# Patient Record
Sex: Female | Born: 1995 | Race: Black or African American | Hispanic: No | Marital: Single | Smoking: Never smoker
Health system: Southern US, Community
[De-identification: ages and names within clinical notes are randomized; demographics above are authoritative.]

## PROBLEM LIST (undated history)

## (undated) DIAGNOSIS — E119 Type 2 diabetes mellitus without complications: Secondary | ICD-10-CM

## (undated) DIAGNOSIS — R569 Unspecified convulsions: Secondary | ICD-10-CM

---
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
## 2015-04-23 ENCOUNTER — Emergency Department (HOSPITAL_COMMUNITY): Payer: Medicaid Other

## 2015-04-23 ENCOUNTER — Encounter (HOSPITAL_COMMUNITY): Payer: Self-pay

## 2015-04-23 DIAGNOSIS — G510 Bell's palsy: Secondary | ICD-10-CM | POA: Diagnosis not present

## 2015-04-23 DIAGNOSIS — R29810 Facial weakness: Secondary | ICD-10-CM | POA: Diagnosis present

## 2015-04-23 LAB — CBC
HCT: 37.9 % (ref 36.0–46.0)
HEMOGLOBIN: 12.4 g/dL (ref 12.0–15.0)
MCH: 21.2 pg — ABNORMAL LOW (ref 26.0–34.0)
MCHC: 32.7 g/dL (ref 30.0–36.0)
MCV: 64.8 fL — ABNORMAL LOW (ref 78.0–100.0)
PLATELETS: 272 10*3/uL (ref 150–400)
RBC: 5.85 MIL/uL — ABNORMAL HIGH (ref 3.87–5.11)
RDW: 15.3 % (ref 11.5–15.5)
WBC: 5.6 10*3/uL (ref 4.0–10.5)

## 2015-04-23 LAB — DIFFERENTIAL
BASOS ABS: 0 10*3/uL (ref 0.0–0.1)
Basophils Relative: 0 %
EOS ABS: 0.1 10*3/uL (ref 0.0–0.7)
Eosinophils Relative: 1 %
LYMPHS ABS: 2.2 10*3/uL (ref 0.7–4.0)
Lymphocytes Relative: 40 %
MONO ABS: 0.3 10*3/uL (ref 0.1–1.0)
Monocytes Relative: 6 %
NEUTROS PCT: 53 %
Neutro Abs: 3 10*3/uL (ref 1.7–7.7)

## 2015-04-23 LAB — I-STAT CHEM 8, ED
BUN: 10 mg/dL (ref 6–20)
CALCIUM ION: 1.2 mmol/L (ref 1.12–1.23)
Chloride: 101 mmol/L (ref 101–111)
Creatinine, Ser: 0.6 mg/dL (ref 0.44–1.00)
GLUCOSE: 100 mg/dL — AB (ref 65–99)
HCT: 44 % (ref 36.0–46.0)
HEMOGLOBIN: 15 g/dL (ref 12.0–15.0)
Potassium: 3.9 mmol/L (ref 3.5–5.1)
Sodium: 140 mmol/L (ref 135–145)
TCO2: 24 mmol/L (ref 0–100)

## 2015-04-23 LAB — PROTIME-INR
INR: 1.1 (ref 0.00–1.49)
Prothrombin Time: 14.4 seconds (ref 11.6–15.2)

## 2015-04-23 LAB — APTT: APTT: 26 s (ref 24–37)

## 2015-04-23 LAB — I-STAT TROPONIN, ED: TROPONIN I, POC: 0 ng/mL (ref 0.00–0.08)

## 2015-04-23 NOTE — ED Notes (Signed)
Pt reports onset 11p-12a last night pt woke up and couldn't close left eye, left facial droop, left side of face feels numb and left side of neck painful when turning neck towards the right. .  No difficulty swallowing.

## 2015-04-23 NOTE — ED Notes (Signed)
Called for triage 3 times. No answer

## 2015-04-24 ENCOUNTER — Emergency Department (HOSPITAL_COMMUNITY)
Admission: EM | Admit: 2015-04-24 | Discharge: 2015-04-24 | Disposition: A | Discharge status: Home / Self Care | Payer: Medicaid Other | Attending: Emergency Medicine | Admitting: Emergency Medicine

## 2015-04-24 DIAGNOSIS — G510 Bell's palsy: Secondary | ICD-10-CM

## 2015-04-24 HISTORY — DX: Unspecified convulsions: R56.9

## 2015-04-24 LAB — COMPREHENSIVE METABOLIC PANEL
ALT: 16 U/L (ref 14–54)
ANION GAP: 10 (ref 5–15)
AST: 20 U/L (ref 15–41)
Albumin: 4 g/dL (ref 3.5–5.0)
Alkaline Phosphatase: 58 U/L (ref 38–126)
BUN: 6 mg/dL (ref 6–20)
CHLORIDE: 103 mmol/L (ref 101–111)
CO2: 23 mmol/L (ref 22–32)
Calcium: 9.6 mg/dL (ref 8.9–10.3)
Creatinine, Ser: 0.62 mg/dL (ref 0.44–1.00)
Glucose, Bld: 105 mg/dL — ABNORMAL HIGH (ref 65–99)
Potassium: 3.7 mmol/L (ref 3.5–5.1)
SODIUM: 136 mmol/L (ref 135–145)
Total Bilirubin: 0.7 mg/dL (ref 0.3–1.2)
Total Protein: 7.8 g/dL (ref 6.5–8.1)

## 2015-04-24 MED ORDER — VALACYCLOVIR HCL 1 G PO TABS: 1000.0000 mg | ORAL_TABLET | Freq: Three times a day (TID) | ORAL | Status: AC

## 2015-04-24 MED ORDER — ARTIFICIAL TEARS OP OINT: TOPICAL_OINTMENT | OPHTHALMIC | Status: DC | PRN

## 2015-04-24 MED ORDER — PREDNISONE 20 MG PO TABS
60.0000 mg | ORAL_TABLET | Freq: Once | ORAL | Status: AC
  Administered 2015-04-24: 60 mg via ORAL
  Filled 2015-04-24: qty 3

## 2015-04-24 MED ORDER — VALACYCLOVIR HCL 500 MG PO TABS
1000.0000 mg | ORAL_TABLET | Freq: Once | ORAL | Status: AC
  Administered 2015-04-24: 1000 mg via ORAL
  Filled 2015-04-24: qty 2

## 2015-04-24 MED ORDER — PREDNISONE 20 MG PO TABS: 60.0000 mg | ORAL_TABLET | Freq: Every day | ORAL | Status: DC

## 2015-04-24 NOTE — ED Provider Notes (Addendum)
TIME SEEN: 4:55 AM  CHIEF COMPLAINT: Left-sided facial droop  HPI: Pt is a 20 y.o. female with history of seizures not on medications who presents to the emergency department with complaints of left-sided facial droop that started last night. States she feels numbness to the lateral left cheek and around the left upper lip. No headache. No history of head injury or neck injury. Does have minimal amount of pain when she turns her head towards the right. No other numbness or focal weakness. No other medical problems. No recent seizure activity. Not on Anticoagulation.  ROS: See HPI Constitutional: no fever  Eyes: no drainage  ENT: no runny nose   Cardiovascular:  no chest pain  Resp: no SOB  GI: no vomiting GU: no dysuria Integumentary: no rash  Allergy: no hives  Musculoskeletal: no leg swelling  Neurological: no slurred speech ROS otherwise negative  PAST MEDICAL HISTORY/PAST SURGICAL HISTORY:  Past Medical History  Diagnosis Date  . Seizures (HCC)     MEDICATIONS:  Prior to Admission medications   Not on File    ALLERGIES:  No Known Allergies  SOCIAL HISTORY:  Social History  Substance Use Topics  . Smoking status: Never Smoker   . Smokeless tobacco: Not on file  . Alcohol Use: No    FAMILY HISTORY: History reviewed. No pertinent family history.  EXAM: BP 123/85 mmHg  Pulse 89  Temp(Src) 99.2 F (37.3 C) (Oral)  Resp 13  Ht 4\' 11"  (1.499 m)  Wt 118 lb 11.2 oz (53.842 kg)  BMI 23.96 kg/m2  SpO2 100%  LMP 04/07/2015 CONSTITUTIONAL: Alert and oriented and responds appropriately to questions. Well-appearing; well-nourished, Afebrile, nontoxic HEAD: Normocephalic EYES: Conjunctivae clear, PERRL ENT: normal nose; no rhinorrhea; moist mucous membranes; TMs are clear bilaterally without erythema, purulence, bulging, perforation, effusion.  No cerumen impaction or sign of foreign body in the external auditory canal. No inflammation, erythema or drainage from the  external auditory canal. No signs of mastoiditis. No pain with manipulation of the pinna bilaterally. No pharyngeal erythema or petechiae, no tonsillar hypertrophy or exudate, no uvular deviation, no trismus or drooling, normal phonation, no stridor, no dental caries or abscess noted, no Ludwig's angina, tongue sits flat in the bottom of the mouth NECK: Supple, no meningismus, no LAD; no midline spinal tenderness or step-off or deformity, no significant pain with movement of the neck and she has full range of motion in her neck, no nuchal rigidity CARD: RRR; S1 and S2 appreciated; no murmurs, no clicks, no rubs, no gallops RESP: Normal chest excursion without splinting or tachypnea; breath sounds clear and equal bilaterally; no wheezes, no rhonchi, no rales, no hypoxia or respiratory distress, speaking full sentences ABD/GI: Normal bowel sounds; non-distended; soft, non-tender, no rebound, no guarding, no peritoneal signs BACK:  The back appears normal and is non-tender to palpation, there is no CVA tenderness EXT: Normal ROM in all joints; non-tender to palpation; no edema; normal capillary refill; no cyanosis, no calf tenderness or swelling    SKIN: Normal color for age and race; warm; no rash NEURO: Moves all extremities equally, sensation to light touch intact diffusely, patient has a left-sided facial droop involving the forehead reports slightly diminished sensation over the left upper lip and left lateral cheek but otherwise sensation to the left face and right face is normal. Otherwise cranial nerves II through XII intact; drinks 5/5 in all 4 extremities. PSYCH: The patient's mood and manner are appropriate. Grooming and personal hygiene are appropriate.  MEDICAL DECISION MAKING: Patient here with Bell's palsy. She has forehead involvement with left-sided facial paralysis. No other neurologic deficit on exam. No rash seen. No sign of Ramsay Hunt syndrome. Labs, head CT ordered in triage are  unremarkable. Doubt deep space neck infection, meningitis, carotid dissection. Will treat with Valtrex and prednisone for the next week. Have advised her use an eye patch when she is sleeping and will discharge with prescription for Lacri-Lube ointment. Will give outpatient follow-up information. I do not feel she needs any other further emergent workup. Discussed with patient and mother return precautions. They verbalize understanding and are comfortable with this plan.     EKG Interpretation  Date/Time:  Tuesday April 23 2015 23:06:14 EDT Ventricular Rate:  80 PR Interval:  162 QRS Duration: 68 QT Interval:  382 QTC Calculation: 440 R Axis:   74 Text Interpretation:  Normal sinus rhythm with sinus arrhythmia Normal ECG No old tracing to compare Confirmed by Alizee Maple,  DO, Nithila Sumners [phone]) on 04/24/2015 5:55:54 AM        Julia Maw Ashelyn Mccravy, DO 04/24/15 0531  Julia Maw Nikkolas Coomes, DO 04/24/15 1914

## 2015-04-24 NOTE — Discharge Instructions (Signed)
I recommend you wear an eye patch when you aresleeping because you are at risk for a corneal abrasion if you are not able to fully close your eye.   Bell Palsy Bell palsy is a condition in which the muscles on one side of the face become paralyzed. This often causes one side of the face to droop. It is a common condition and most people recover completely. RISK FACTORS Risk factors for Bell palsy include:  Pregnancy.  Diabetes.  An infection by a virus, such as infections that cause cold sores. CAUSES  Bell palsy is caused by damage to or inflammation of a nerve in your face. It is unclear why this happens, but an infection by a virus may lead to it. Most of the time the reason it happens is unknown. SIGNS AND SYMPTOMS  Symptoms can range from mild to severe and can take place over a number of hours. Symptoms may include:  Being unable to:  Raise one or both eyebrows.  Close one or both eyes.  Feel parts of your face (facial numbness).  Drooping of the eyelid and corner of the mouth.  Weakness in the face.  Paralysis of half your face.  Loss of taste.  Sensitivity to loud noises.  Difficulty chewing.  Tearing up of the affected eye.  Dryness in the affected eye.  Drooling.  Pain behind one ear. DIAGNOSIS  Diagnosis of Bell palsy may include:  A medical history and physical exam.  An MRI.  A CT scan.  Electromyography (EMG). This is a test that checks how your nerves are working. TREATMENT  Treatment may include antiviral medicine to help shorten the length of the condition. Sometimes treatment is not needed and the symptoms go away on their own. HOME CARE INSTRUCTIONS   Take medicines only as directed by your health care provider.  Do facial massages and exercises as directed by your health care provider.  If your eye is affected:  Use moisturizing eye drops to prevent drying of your eye as directed by your health care provider.  Protect your eye as  directed by your health care provider. SEEK MEDICAL CARE IF:  Your symptoms do not get better or get worse.  You are drooling.  Your eye is red, irritated, or hurts. SEEK IMMEDIATE MEDICAL CARE IF:   Another part of your body feels weak or numb.  You have difficulty swallowing.  You have a fever along with symptoms of Bell palsy.  You develop neck pain. MAKE SURE YOU:   Understand these instructions.  Will watch your condition.  Will get help right away if you are not doing well or get worse.   This information is not intended to replace advice given to you by your health care provider. Make sure you discuss any questions you have with your health care provider.   Document Released: 01/19/2005 Document Revised: 10/10/2014 Document Reviewed: 04/28/2013 Elsevier Interactive Patient Education [employer]2016 Elsevier Inc.

## 2017-04-24 ENCOUNTER — Other Ambulatory Visit: Payer: Self-pay

## 2017-04-24 ENCOUNTER — Encounter (HOSPITAL_COMMUNITY): Payer: Self-pay | Admitting: Emergency Medicine

## 2017-04-24 DIAGNOSIS — Z794 Long term (current) use of insulin: Secondary | ICD-10-CM

## 2017-04-24 DIAGNOSIS — E871 Hypo-osmolality and hyponatremia: Secondary | ICD-10-CM | POA: Diagnosis present

## 2017-04-24 DIAGNOSIS — E1010 Type 1 diabetes mellitus with ketoacidosis without coma: Principal | ICD-10-CM | POA: Diagnosis present

## 2017-04-24 DIAGNOSIS — E875 Hyperkalemia: Secondary | ICD-10-CM | POA: Diagnosis present

## 2017-04-24 DIAGNOSIS — Z98818 Other dental procedure status: Secondary | ICD-10-CM

## 2017-04-24 DIAGNOSIS — N179 Acute kidney failure, unspecified: Secondary | ICD-10-CM | POA: Diagnosis present

## 2017-04-24 DIAGNOSIS — Z23 Encounter for immunization: Secondary | ICD-10-CM

## 2017-04-24 DIAGNOSIS — Z793 Long term (current) use of hormonal contraceptives: Secondary | ICD-10-CM

## 2017-04-24 NOTE — ED Triage Notes (Signed)
Pt states she is having increase fatigue, poor appetite,  No drinking well and loosing weight rapidly, denies any fever or chills no urinary symptoms states she is having 9/10 lower back pain, denies any recent injury.

## 2017-04-25 ENCOUNTER — Inpatient Hospital Stay (HOSPITAL_COMMUNITY)
Admission: EM | Admit: 2017-04-25 | Discharge: 2017-04-27 | DRG: 638 | Disposition: A | Discharge status: Home / Self Care | Payer: Medicaid Other | Attending: Internal Medicine | Admitting: Internal Medicine

## 2017-04-25 ENCOUNTER — Encounter (HOSPITAL_COMMUNITY): Payer: Self-pay

## 2017-04-25 ENCOUNTER — Emergency Department (HOSPITAL_COMMUNITY): Payer: Medicaid Other

## 2017-04-25 DIAGNOSIS — Z793 Long term (current) use of hormonal contraceptives: Secondary | ICD-10-CM | POA: Diagnosis not present

## 2017-04-25 DIAGNOSIS — Z98818 Other dental procedure status: Secondary | ICD-10-CM | POA: Diagnosis not present

## 2017-04-25 DIAGNOSIS — E875 Hyperkalemia: Secondary | ICD-10-CM | POA: Diagnosis present

## 2017-04-25 DIAGNOSIS — E871 Hypo-osmolality and hyponatremia: Secondary | ICD-10-CM | POA: Diagnosis present

## 2017-04-25 DIAGNOSIS — E1010 Type 1 diabetes mellitus with ketoacidosis without coma: Secondary | ICD-10-CM | POA: Diagnosis present

## 2017-04-25 DIAGNOSIS — N179 Acute kidney failure, unspecified: Secondary | ICD-10-CM | POA: Diagnosis present

## 2017-04-25 DIAGNOSIS — Z23 Encounter for immunization: Secondary | ICD-10-CM | POA: Diagnosis not present

## 2017-04-25 DIAGNOSIS — E1310 Other specified diabetes mellitus with ketoacidosis without coma: Secondary | ICD-10-CM

## 2017-04-25 DIAGNOSIS — E1110 Type 2 diabetes mellitus with ketoacidosis without coma: Secondary | ICD-10-CM | POA: Diagnosis present

## 2017-04-25 DIAGNOSIS — E1069 Type 1 diabetes mellitus with other specified complication: Secondary | ICD-10-CM

## 2017-04-25 DIAGNOSIS — Z794 Long term (current) use of insulin: Secondary | ICD-10-CM | POA: Diagnosis not present

## 2017-04-25 LAB — BASIC METABOLIC PANEL
ANION GAP: 12 (ref 5–15)
ANION GAP: 26 — AB (ref 5–15)
BUN: 19 mg/dL (ref 6–20)
BUN: 27 mg/dL — ABNORMAL HIGH (ref 6–20)
CALCIUM: 8.8 mg/dL — AB (ref 8.9–10.3)
CHLORIDE: 85 mmol/L — AB (ref 101–111)
CO2: 12 mmol/L — ABNORMAL LOW (ref 22–32)
CO2: 18 mmol/L — AB (ref 22–32)
Calcium: 10.1 mg/dL (ref 8.9–10.3)
Chloride: 109 mmol/L (ref 101–111)
Creatinine, Ser: 0.69 mg/dL (ref 0.44–1.00)
Creatinine, Ser: 1.3 mg/dL — ABNORMAL HIGH (ref 0.44–1.00)
GFR calc Af Amer: >60 mL/min (ref >60)
GFR calc non Af Amer: >60 mL/min (ref >60)
GFR, EST NON AFRICAN AMERICAN: 58 mL/min — AB (ref >60)
GLUCOSE: 191 mg/dL — AB (ref 65–99)
Glucose, Bld: 1018 mg/dL (ref 65–99)
POTASSIUM: 3.7 mmol/L (ref 3.5–5.1)
POTASSIUM: 6.4 mmol/L — AB (ref 3.5–5.1)
SODIUM: 123 mmol/L — AB (ref 135–145)
Sodium: 139 mmol/L (ref 135–145)

## 2017-04-25 LAB — CBG MONITORING, ED
GLUCOSE-CAPILLARY: 533 mg/dL — AB (ref 65–99)
GLUCOSE-CAPILLARY: 350 mg/dL — AB (ref 65–99)
GLUCOSE-CAPILLARY: 294 mg/dL — AB (ref 65–99)
GLUCOSE-CAPILLARY: 163 mg/dL — AB (ref 65–99)
GLUCOSE-CAPILLARY: 160 mg/dL — AB (ref 65–99)
Glucose-Capillary: >600 mg/dL (ref 65–99)
Glucose-Capillary: 512 mg/dL (ref 65–99)
Glucose-Capillary: 255 mg/dL — ABNORMAL HIGH (ref 65–99)
Glucose-Capillary: 152 mg/dL — ABNORMAL HIGH (ref 65–99)

## 2017-04-25 LAB — CBC
HEMATOCRIT: 41.6 % (ref 36.0–46.0)
HEMATOCRIT: 45.9 % (ref 36.0–46.0)
HEMOGLOBIN: 14.4 g/dL (ref 12.0–15.0)
Hemoglobin: 15.9 g/dL — ABNORMAL HIGH (ref 12.0–15.0)
MCH: 22.5 pg — AB (ref 26.0–34.0)
MCH: 23.1 pg — ABNORMAL LOW (ref 26.0–34.0)
MCHC: 34.6 g/dL (ref 30.0–36.0)
MCHC: 34.6 g/dL (ref 30.0–36.0)
MCV: 65.1 fL — ABNORMAL LOW (ref 78.0–100.0)
MCV: 66.7 fL — AB (ref 78.0–100.0)
Platelets: 349 10*3/uL (ref 150–400)
Platelets: 432 10*3/uL — ABNORMAL HIGH (ref 150–400)
RBC: 6.39 MIL/uL — ABNORMAL HIGH (ref 3.87–5.11)
RBC: 6.88 MIL/uL — AB (ref 3.87–5.11)
RDW: 13.5 % (ref 11.5–15.5)
RDW: 13.4 % (ref 11.5–15.5)
WBC: 12 10*3/uL — ABNORMAL HIGH (ref 4.0–10.5)
WBC: 8.8 10*3/uL (ref 4.0–10.5)

## 2017-04-25 LAB — URINALYSIS, ROUTINE W REFLEX MICROSCOPIC
BACTERIA UA: NONE SEEN
BILIRUBIN URINE: NEGATIVE
Glucose, UA: >=500 mg/dL — AB
Ketones, ur: 20 mg/dL — AB
NITRITE: NEGATIVE
PH: 6 (ref 5.0–8.0)
Protein, ur: 30 mg/dL — AB
Specific Gravity, Urine: 1.024 (ref 1.005–1.030)
Squamous Epithelial / LPF: NONE SEEN

## 2017-04-25 LAB — I-STAT VENOUS BLOOD GAS, ED
ACID-BASE DEFICIT: 15 mmol/L — AB (ref 0.0–2.0)
Bicarbonate: 11.4 mmol/L — ABNORMAL LOW (ref 20.0–28.0)
O2 SAT: 50 %
PCO2 VEN: 29.7 mmHg — AB (ref 44.0–60.0)
TCO2: 12 mmol/L — AB (ref 22–32)
pH, Ven: 7.193 — CL (ref 7.250–7.430)
pO2, Ven: 32 mmHg (ref 32.0–45.0)

## 2017-04-25 LAB — I-STAT BETA HCG BLOOD, ED (MC, WL, AP ONLY): I-stat hCG, quantitative: <5 m[IU]/mL (ref <5)

## 2017-04-25 LAB — MRSA PCR SCREENING: MRSA by PCR: NEGATIVE

## 2017-04-25 LAB — GLUCOSE, CAPILLARY
GLUCOSE-CAPILLARY: 163 mg/dL — AB (ref 65–99)
GLUCOSE-CAPILLARY: 151 mg/dL — AB (ref 65–99)
Glucose-Capillary: 151 mg/dL — ABNORMAL HIGH (ref 65–99)
Glucose-Capillary: 330 mg/dL — ABNORMAL HIGH (ref 65–99)
Glucose-Capillary: 368 mg/dL — ABNORMAL HIGH (ref 65–99)

## 2017-04-25 MED ORDER — AMOXICILLIN 500 MG PO CAPS
500.0000 mg | ORAL_CAPSULE | Freq: Three times a day (TID) | ORAL | Status: AC
  Administered 2017-04-25 – 2017-04-26 (×6): 500 mg via ORAL
  Filled 2017-04-25 (×6): qty 1

## 2017-04-25 MED ORDER — INSULIN ASPART 100 UNIT/ML ~~LOC~~ SOLN
0.0000 [IU] | Freq: Three times a day (TID) | SUBCUTANEOUS | Status: DC
  Administered 2017-04-25: 2 [IU] via SUBCUTANEOUS

## 2017-04-25 MED ORDER — INSULIN ASPART 100 UNIT/ML ~~LOC~~ SOLN
0.0000 [IU] | Freq: Every day | SUBCUTANEOUS | Status: DC
  Administered 2017-04-25: 4 [IU] via SUBCUTANEOUS

## 2017-04-25 MED ORDER — SODIUM CHLORIDE 0.9 % IV SOLN
INTRAVENOUS | Status: DC
  Administered 2017-04-25: 12:00:00 via INTRAVENOUS

## 2017-04-25 MED ORDER — HEPARIN SODIUM (PORCINE) 5000 UNIT/ML IJ SOLN
5000.0000 [IU] | Freq: Three times a day (TID) | INTRAMUSCULAR | Status: DC
  Administered 2017-04-25 – 2017-04-27 (×5): 5000 [IU] via SUBCUTANEOUS
  Filled 2017-04-25 (×6): qty 1

## 2017-04-25 MED ORDER — ENSURE ENLIVE PO LIQD
237.0000 mL | Freq: Two times a day (BID) | ORAL | Status: DC
  Administered 2017-04-25 – 2017-04-26 (×3): 237 mL via ORAL

## 2017-04-25 MED ORDER — SODIUM CHLORIDE 0.9 % IV BOLUS (SEPSIS)
1000.0000 mL | Freq: Once | INTRAVENOUS | Status: AC
  Administered 2017-04-25: 1000 mL via INTRAVENOUS

## 2017-04-25 MED ORDER — SODIUM CHLORIDE 0.9 % IV SOLN
INTRAVENOUS | Status: DC
  Administered 2017-04-25: 7.8 [IU]/h via INTRAVENOUS

## 2017-04-25 MED ORDER — IOPAMIDOL (ISOVUE-300) INJECTION 61%
INTRAVENOUS | Status: AC
  Administered 2017-04-25: 100 mL
  Filled 2017-04-25: qty 100

## 2017-04-25 MED ORDER — ACETAMINOPHEN 325 MG PO TABS
650.0000 mg | ORAL_TABLET | Freq: Four times a day (QID) | ORAL | Status: DC | PRN
  Administered 2017-04-26 – 2017-04-27 (×3): 650 mg via ORAL
  Filled 2017-04-25 (×4): qty 2

## 2017-04-25 MED ORDER — SODIUM CHLORIDE 0.9 % IV SOLN
INTRAVENOUS | Status: DC
  Administered 2017-04-25: 07:00:00 via INTRAVENOUS

## 2017-04-25 MED ORDER — DEXTROSE-NACL 5-0.45 % IV SOLN
INTRAVENOUS | Status: DC
  Administered 2017-04-25: 12:00:00 via INTRAVENOUS

## 2017-04-25 MED ORDER — DEXTROSE-NACL 5-0.45 % IV SOLN: INTRAVENOUS | Status: DC

## 2017-04-25 MED ORDER — INSULIN GLARGINE 100 UNIT/ML ~~LOC~~ SOLN
7.0000 [IU] | Freq: Every day | SUBCUTANEOUS | Status: DC
  Administered 2017-04-25: 7 [IU] via SUBCUTANEOUS
  Filled 2017-04-25 (×4): qty 0.07

## 2017-04-25 MED ORDER — INSULIN ASPART 100 UNIT/ML ~~LOC~~ SOLN
3.0000 [IU] | Freq: Three times a day (TID) | SUBCUTANEOUS | Status: DC
  Administered 2017-04-25 – 2017-04-27 (×6): 3 [IU] via SUBCUTANEOUS

## 2017-04-25 MED ORDER — INSULIN REGULAR HUMAN 100 UNIT/ML IJ SOLN
INTRAMUSCULAR | Status: DC
  Administered 2017-04-25: 5.4 [IU]/h via INTRAVENOUS
  Filled 2017-04-25: qty 1

## 2017-04-25 MED ORDER — PNEUMOCOCCAL VAC POLYVALENT 25 MCG/0.5ML IJ INJ
0.5000 mL | INJECTION | INTRAMUSCULAR | Status: AC
  Administered 2017-04-27: 0.5 mL via INTRAMUSCULAR

## 2017-04-25 MED ORDER — INFLUENZA VAC SPLIT QUAD 0.5 ML IM SUSY
0.5000 mL | PREFILLED_SYRINGE | INTRAMUSCULAR | Status: AC
  Administered 2017-04-27: 0.5 mL via INTRAMUSCULAR

## 2017-04-25 NOTE — ED Notes (Signed)
Admitting paged and notified of pt most recent CBG and BMP

## 2017-04-25 NOTE — ED Provider Notes (Signed)
[facility] EMERGENCY DEPARTMENT Provider Note   CSN: [id number] Arrival date & time: 04/24/17  2205     History   Chief Complaint Chief Complaint  Patient presents with  . Fatigue    HPI [name] is a 22 y.o. female.  Patient presents to the emergency department for evaluation of generalized fatigue.  She reports that she has not been feeling well for some time.  She has noticed that she has been very fatigued and her appetite has changed.  She has had some weight loss recently.  Over the last few days or so she has had increased thirst, increased drinking, increased urination.  She has noticed blurred vision.  She does report some abdominal pain and cramping, nausea but no vomiting.  She also has low back pain.  She denies any injury.  Pain does not radiate to the legs.  No saddle anesthesia, change in bowel or bladder function, numbness, weakness of lower extremities.     Past Medical History:  Diagnosis Date  . Seizures [facility])     Patient Active Problem List   Diagnosis Date Noted  . DKA (diabetic ketoacidoses) (HCC) 04/25/2017    History reviewed. No pertinent surgical history.   OB History   None      Home Medications    Prior to Admission medications   Medication Sig Start Date End Date Taking? Authorizing Provider  amoxicillin (AMOXIL) 500 MG capsule Take 500 mg by mouth 3 (three) times daily.   Yes [provider]  dexamethasone (DECADRON) 4 MG tablet Take 4 mg by mouth 3 (three) times daily.   Yes [provider]  artificial tears (LACRILUBE) OINT ophthalmic ointment Place into the left eye every 4 (four) hours as needed for dry eyes. Patient not taking: Reported on 04/25/2017 04/24/15   Ward, Layla Maw, DO  predniSONE (DELTASONE) 20 MG tablet Take 3 tablets (60 mg total) by mouth daily. Take for 6 more days (starting morning of 3/23) Patient not taking: Reported on 04/25/2017 04/24/15   Ward, Layla Maw, DO    Family  History No family history on file.  Social History Social History   Tobacco Use  . Smoking status: Never Smoker  Substance Use Topics  . Alcohol use: No  . Drug use: No     Allergies   Patient has no known allergies.   Review of Systems Review of Systems  Eyes: Positive for visual disturbance.  Gastrointestinal: Positive for abdominal pain and nausea.  Endocrine: Positive for polydipsia and polyuria.  Musculoskeletal: Positive for back pain.  All other systems reviewed and are negative.    Physical Exam Updated Vital Signs BP (!) 130/100 (BP Location: Right Arm)   Pulse (!) 114   Temp 98.5 F (36.9 C) (Oral)   Resp 16   Ht 4\' 11"  (1.499 m)   Wt 45.4 kg (100 lb)   LMP 04/24/2017   SpO2 100%   BMI 20.20 kg/m   Physical Exam  Constitutional: She is oriented to person, place, and time. She appears well-developed and well-nourished. No distress.  HENT:  Head: Normocephalic and atraumatic.  Right Ear: Hearing normal.  Left Ear: Hearing normal.  Nose: Nose normal.  Mouth/Throat: Oropharynx is clear and moist and mucous membranes are normal.  Eyes: Pupils are equal, round, and reactive to light. Conjunctivae and EOM are normal.  Neck: Normal range of motion. Neck supple.  Cardiovascular: Regular rhythm, S1 normal and S2 normal. Exam reveals no gallop and  no friction rub.  No murmur heard. Pulmonary/Chest: Effort normal and breath sounds normal. No respiratory distress. She exhibits no tenderness.  Abdominal: Soft. Normal appearance and bowel sounds are normal. There is no hepatosplenomegaly. There is generalized tenderness. There is no rebound, no guarding, no tenderness at McBurney's point and negative Murphy's sign. No hernia.  Musculoskeletal: Normal range of motion.       Lumbar back: She exhibits tenderness.  Neurological: She is alert and oriented to person, place, and time. She has normal strength. No cranial nerve deficit or sensory deficit. Coordination  normal. GCS eye subscore is 4. GCS verbal subscore is 5. GCS motor subscore is 6.  Skin: Skin is warm, dry and intact. No rash noted. No cyanosis.  Psychiatric: She has a normal mood and affect. Her speech is normal and behavior is normal. Thought content normal.  Nursing note and vitals reviewed.    ED Treatments / Results  Labs (all labs ordered are listed, but only abnormal results are displayed) Labs Reviewed  BASIC METABOLIC PANEL - Abnormal; Notable for the following components:      Result Value   Sodium 123 (*)    Potassium 6.4 (*)    Chloride 85 (*)    CO2 12 (*)    Glucose, Bld 1,018 (*)    BUN 27 (*)    Creatinine, Ser 1.30 (*)    GFR calc non Af Amer 58 (*)    Anion gap 26 (*)    All other components within normal limits  CBC - Abnormal; Notable for the following components:   RBC 6.88 (*)    Hemoglobin 15.9 (*)    MCV 66.7 (*)    MCH 23.1 (*)    Platelets 432 (*)    All other components within normal limits  URINALYSIS, ROUTINE W REFLEX MICROSCOPIC - Abnormal; Notable for the following components:   APPearance HAZY (*)    Glucose, UA >=500 (*)    Hgb urine dipstick LARGE (*)    Ketones, ur 20 (*)    Protein, ur 30 (*)    Leukocytes, UA TRACE (*)    All other components within normal limits  CBG MONITORING, ED - Abnormal; Notable for the following components:   Glucose-Capillary >600 (*)    All other components within normal limits  I-STAT VENOUS BLOOD GAS, ED - Abnormal; Notable for the following components:   pH, Ven 7.193 (*)    pCO2, Ven 29.7 (*)    Bicarbonate 11.4 (*)    TCO2 12 (*)    Acid-base deficit 15.0 (*)    All other components within normal limits  BLOOD GAS, VENOUS  I-STAT BETA HCG BLOOD, ED ([facility], [facility], [facility] ONLY)    EKG None  Radiology Ct Abdomen Pelvis W Contrast  Result Date: 04/25/2017 CLINICAL DATA:  Increased fatigue and poor appetite. EXAM: CT ABDOMEN AND PELVIS WITH CONTRAST TECHNIQUE: Multidetector CT imaging of the abdomen and  pelvis was performed using the standard protocol following bolus administration of intravenous contrast. CONTRAST:  80 cc ISOVUE-300 IOPAMIDOL (ISOVUE-300) INJECTION 61% COMPARISON:  None. FINDINGS: Lower chest: Normal size heart.  Clear lung bases. Hepatobiliary: No focal liver abnormality is seen. No gallstones, gallbladder wall thickening, or biliary dilatation. Pancreas: Unremarkable. No pancreatic ductal dilatation or surrounding inflammatory changes. Spleen: Normal in size without focal abnormality. Adrenals/Urinary Tract: Adrenal glands are unremarkable. Kidneys are normal, without renal calculi, focal lesion, or hydronephrosis. Bladder is unremarkable. Stomach/Bowel: Physiologic distention of the stomach with normal small  bowel rotation. No bowel obstruction or inflammation. A significant amount of retained stool is seen throughout the colon without obstruction or inflammation. Appendix is normal. Vascular/Lymphatic: No significant vascular findings are present. No enlarged abdominal or pelvic lymph nodes. Reproductive: Uterus and bilateral adnexa are unremarkable. Other: No free air nor free fluid. Musculoskeletal: No acute or significant osseous findings. IMPRESSION: Normal appendix. Increased colonic stool burden. No bowel obstruction or inflammation. Electronically Signed   By: Tollie Eth M.D.   On: 04/25/2017 04:00    Procedures Procedures (including critical care time)  Medications Ordered in ED Medications  insulin regular (NOVOLIN R,HUMULIN R) 100 Units in sodium chloride 0.9 % 100 mL (1 Units/mL) infusion (5.4 Units/hr Intravenous New Bag/Given 04/25/17 0413)  sodium chloride 0.9 % bolus 1,000 mL (1,000 mLs Intravenous New Bag/Given 04/25/17 0248)    And  sodium chloride 0.9 % bolus 1,000 mL (has no administration in time range)    And  0.9 %  sodium chloride infusion (has no administration in time range)  dextrose 5 %-0.45 % sodium chloride infusion (has no administration in time  range)  iopamidol (ISOVUE-300) 61 % injection (100 mLs  Contrast Given 04/25/17 0331)     Initial Impression / Assessment and Plan / ED Course  I have reviewed the triage vital signs and the nursing notes.  Pertinent labs & imaging results that were available during my care of the patient were reviewed by me and considered in my medical decision making (see chart for details).     Patient presents to the ER for evaluation of generalized fatigue.  Patient reports polyuria, polydipsia, blurred vision, appetite change, weight change.  She was found to be hyperglycemic.  Blood sugar is over 1000 with anion gap acidosis consistent with diabetic ketoacidosis.  Patient initiated on IV fluids, IV insulin.  She did have diffuse and generalized abdominal tenderness, no focal tenderness.  CT abdomen and pelvis was performed to further evaluate for possible infectious etiology causing her hyperglycemia and DKA.  CT scan did not show any acute abnormality.  She is, however, on Decadron for recent dental procedure.    Patient is complaining of some low back pain, no evidence of urinary tract infection.  Pain is nonradicular, no neurologic deficits.  She is afebrile.  She is not an IV drug user or have any risk for discitis, spinal infection.  This can be monitored, MRI during hospitalization if she does not improve.  Patient will be admitted to stepdown unit for treatment of new onset diabetes with DKA.  CRITICAL CARE Performed by: Gilda Crease   Total critical care time: 30 minutes  Critical care time was exclusive of separately billable procedures and treating other patients.  Critical care was necessary to treat or prevent imminent or life-threatening deterioration.  Critical care was time spent personally by me on the following activities: development of treatment plan with patient and/or surrogate as well as nursing, discussions with consultants, evaluation of patient's response to  treatment, examination of patient, obtaining history from patient or surrogate, ordering and performing treatments and interventions, ordering and review of laboratory studies, ordering and review of radiographic studies, pulse oximetry and re-evaluation of patient's condition.   Final Clinical Impressions(s) / ED Diagnoses   Final diagnoses:  Diabetic ketoacidosis without coma associated with other specified diabetes mellitus [facility])    ED Discharge Orders    None       Gilda Crease, MD 04/25/17 [phone]

## 2017-04-25 NOTE — H&P (Signed)
PCP:  Gwinda PasseMichelle Edwards NP  Chief Complaint:  Nausea, vomiting  HPI: This is a 22 year old female who over the past week to 2 has been losing weight, unclear amount.  She is also had nausea and vomiting, blurred vision, nausea but no vomiting.  She has generalized abdominal pain and some back pain.  She denies any fever, chills.  She reports polyuria and polydipsia.  Today she had a slight fainting spell.  Her mom brought her to the ER.  The patient is slightly mentally delayed, mom is concerned about her ability to follow a type I diabetic diet and regimen.  The patient recently had her wisdom tooth pulled.  She is on prednisone and amoxicillin history provided by the patient as well as  History provided by the patient and her mom who is present at bedside  Review of Systems:  The patient denies anorexia, fever, weight loss,, vision loss, decreased hearing, hoarseness, chest pain, syncope, dyspnea on exertion, peripheral edema, balance deficits, hemoptysis, abdominal pain, nausea, vomiting, weight loss, blurred vision, melena, hematochezia, severe indigestion/heartburn, hematuria, incontinence, genital sores, muscle weakness, suspicious skin lesions, transient blindness, difficulty walking, depression, unusual weight change, abnormal bleeding, enlarged lymph nodes, angioedema, and breast masses.  Past Medical History: Past Medical History:  Diagnosis Date  . Seizures (HCC)    History reviewed. No pertinent surgical history.  EKG order set initiated  Medications: Prior to Admission medications   Medication Sig Start Date End Date Taking? Authorizing Provider  amoxicillin (AMOXIL) 500 MG capsule Take 500 mg by mouth 3 (three) times daily.   Yes [provider]  dexamethasone (DECADRON) 4 MG tablet Take 4 mg by mouth 3 (three) times daily.   Yes [provider]  artificial tears (LACRILUBE) OINT ophthalmic ointment Place into the left eye every 4 (four) hours as needed for  dry eyes. Patient not taking: Reported on 04/25/2017 04/24/15   Ward, Layla MawKristen N, DO  predniSONE (DELTASONE) 20 MG tablet Take 3 tablets (60 mg total) by mouth daily. Take for 6 more days (starting morning of 3/23) Patient not taking: Reported on 04/25/2017 04/24/15   Ward, Layla MawKristen N, DO    Allergies:  No Known Allergies  Social History:  reports that she has never smoked. She does not have any smokeless tobacco history on file. She reports that she does not drink alcohol or use drugs.  Family History: hypertension  Physical Exam: Vitals:   04/24/17 2315 04/24/17 2318 04/25/17 0256  BP: (!) 139/95  (!) 130/100  Pulse: (!) 110  (!) 114  Resp: 16  16  Temp: 98.5 F (36.9 C)    TempSrc: Oral    SpO2: 100%  100%  Weight:  45.4 kg (100 lb)   Height:  4\' 11"  (1.499 m)     General:  Alert and oriented times three, well developed and nourished, slender, no acute distress Eyes: PERRLA, pink conjunctiva, no scleral icterus ENT: dry oral mucosa, neck supple, no thyromegaly Lungs: clear to ascultation, no wheeze, no crackles, no use of accessory muscles Cardiovascular: regular rate and rhythm, no regurgitation, no gallops, no murmurs. No carotid bruits, no JVD Abdomen: soft, positive BS, nonspecific tenderness to palpation, non-distended, no organomegaly, not an acute abdomen GU: not examined Neuro: CN II - XII grossly intact, sensation intact Musculoskeletal: strength 5/5 all extremities, no clubbing, cyanosis or edema Skin: no rash, no subcutaneous crepitation, no decubitus Psych: appropriate patient   Labs on Admission:  Recent Labs    04/24/17 2337  NA 123*  K 6.4*  CL 85*  CO2 12*  GLUCOSE 1,018*  BUN 27*  CREATININE 1.30*  CALCIUM 10.1   No results for input(s): AST, ALT, ALKPHOS, BILITOT, PROT, ALBUMIN in the last 72 hours. No results for input(s): LIPASE, AMYLASE in the last 72 hours. Recent Labs    04/24/17 2337  WBC 8.8  HGB 15.9*  HCT 45.9  MCV 66.7*  PLT  432*   No results for input(s): CKTOTAL, CKMB, CKMBINDEX, TROPONINI in the last 72 hours. Invalid input(s): POCBNP No results for input(s): DDIMER in the last 72 hours. No results for input(s): HGBA1C in the last 72 hours. No results for input(s): CHOL, HDL, LDLCALC, TRIG, CHOLHDL, LDLDIRECT in the last 72 hours. No results for input(s): TSH, T4TOTAL, T3FREE, THYROIDAB in the last 72 hours.  Invalid input(s): FREET3 No results for input(s): VITAMINB12, FOLATE, FERRITIN, TIBC, IRON, RETICCTPCT in the last 72 hours.  Micro Results: No results found for this or any previous visit (from the past 240 hour(s)).   Radiological Exams on Admission: Ct Abdomen Pelvis W Contrast  Result Date: 04/25/2017 CLINICAL DATA:  Increased fatigue and poor appetite. EXAM: CT ABDOMEN AND PELVIS WITH CONTRAST TECHNIQUE: Multidetector CT imaging of the abdomen and pelvis was performed using the standard protocol following bolus administration of intravenous contrast. CONTRAST:  80 cc ISOVUE-300 IOPAMIDOL (ISOVUE-300) INJECTION 61% COMPARISON:  None. FINDINGS: Lower chest: Normal size heart.  Clear lung bases. Hepatobiliary: No focal liver abnormality is seen. No gallstones, gallbladder wall thickening, or biliary dilatation. Pancreas: Unremarkable. No pancreatic ductal dilatation or surrounding inflammatory changes. Spleen: Normal in size without focal abnormality. Adrenals/Urinary Tract: Adrenal glands are unremarkable. Kidneys are normal, without renal calculi, focal lesion, or hydronephrosis. Bladder is unremarkable. Stomach/Bowel: Physiologic distention of the stomach with normal small bowel rotation. No bowel obstruction or inflammation. A significant amount of retained stool is seen throughout the colon without obstruction or inflammation. Appendix is normal. Vascular/Lymphatic: No significant vascular findings are present. No enlarged abdominal or pelvic lymph nodes. Reproductive: Uterus and bilateral adnexa are  unremarkable. Other: No free air nor free fluid. Musculoskeletal: No acute or significant osseous findings. IMPRESSION: Normal appendix. Increased colonic stool burden. No bowel obstruction or inflammation. Electronically Signed   By: Tollie Eth M.D.   On: 04/25/2017 04:00    Assessment/Plan Present on Admission: . DKA (diabetic ketoacidoses) (HCC) -DKA order set initiated -Diabetes education  Wisdom tooth pulled -continue amoxicillin, d/c prednisone  General  -continue oral contraceptives  Lois Slagel 04/25/2017, 4:53 AM

## 2017-04-25 NOTE — H&P (Signed)
History and Physical  Patient Name: [name]     ZOX:[id number]    DOB: 29-Oct-1995    DOA: 04/25/2017 PCP: Patient, No Pcp Per  Patient coming from: Home  Chief Complaint: Vomiting, abdominal pain      HPI: [name] is a 22 y.o. female with no significant past medical history who presents with several days malaise, abdominal pain, vomiting.  The patient is in her usual state of health until a few days ago when she had her wisdom teeth removed, and since then has had poor p.o. intake, malaise, now progressing to recurrent vomiting, and generalized constant severe diffuse abdominal pain.  ED course: -Afebrile, heart rate 110, respirations and pulse ox normal, blood pressure 139/95 -Na 123, K 6.4, Cr 1.3 (baseline 0.6), WBC 8.8K, Hgb 15.9 -Pregnancy test negative -VBG showed pH 7.2, bicarb 12, glucose 1018, AG 26 -CT abdomen and pelvis was negative -She was given IV fluids, IV insulin and TRH were asked to evaluate for acidosis  She has no prior history of diabetes.  She has no family history of autoimmune disease, lupus, rheumatism, Crohn's disease, diabetes, or thyroid disease.  On further questioning, she has had some weight loss over the last month, but no previous symptoms of polyuria, polydipsia until recently.       ROS: Review of Systems  Constitutional: Positive for malaise/fatigue and weight loss. Negative for chills and fever.  Cardiovascular: Negative for chest pain.  Gastrointestinal: Positive for abdominal pain, nausea and vomiting.  Genitourinary: Negative for dysuria, flank pain, frequency, hematuria and urgency.  Neurological: Positive for dizziness. Negative for sensory change, speech change, focal weakness, seizures, loss of consciousness and weakness.  Endo/Heme/Allergies: Positive for polydipsia.  All other systems reviewed and are negative.         Past Medical History:  Diagnosis Date  . Seizures (HCC)   . Seizures (HCC)     History  reviewed. No pertinent surgical history.  Social History: Patient lives with her mother.  She does not work.  The patient walks unassisted.  Nonsmoker.  Does not use alcohol or illicit drugs.  No Known Allergies  Family history: Hypertension.  No family history of autoimmune disease.  Prior to Admission medications   Medication Sig Start Date End Date Taking? Authorizing Provider  amoxicillin (AMOXIL) 500 MG capsule Take 500 mg by mouth 3 (three) times daily.   Yes [provider]  dexamethasone (DECADRON) 4 MG tablet Take 4 mg by mouth 3 (three) times daily.   Yes [provider]  artificial tears (LACRILUBE) OINT ophthalmic ointment Place into the left eye every 4 (four) hours as needed for dry eyes. Patient not taking: Reported on 04/25/2017 04/24/15   Ward, Layla Maw, DO  predniSONE (DELTASONE) 20 MG tablet Take 3 tablets (60 mg total) by mouth daily. Take for 6 more days (starting morning of 3/23) Patient not taking: Reported on 04/25/2017 04/24/15   Ward, Layla Maw, DO       Physical Exam: BP 109/69 (BP Location: Left Arm)   Pulse 92   Temp 98.4 F (36.9 C) (Oral)   Resp 18   Ht 4\' 11"  (1.499 m)   Wt 41.2 kg (90 lb 14.4 oz)   LMP 04/24/2017   SpO2 100%   BMI 18.36 kg/m  General appearance: Thin adult female, sleepy, appears tired, no obvious distress, interactive.   Eyes: Anicteric, conjunctiva pink, lids and lashes normal. PERRL.    ENT: No nasal deformity, discharge, epistaxis.  Hearing  normal. OP dry without lesions.   Neck: No neck masses.  Trachea midline.  No thyromegaly/tenderness. Lymph: No cervical or supraclavicular lymphadenopathy. Skin: Warm and dry.  Numerous hyperpigmentations of old folliculitis.. Cardiac: Tachycardic, regular, nl S1-S2, no murmurs appreciated.  Capillary refill is brisk.  JVP normal.  No LE edema.  Radial and DP pulses 2+ and symmetric. Respiratory: Normal respiratory rate and rhythm.  CTAB without rales or  wheezes. Abdomen: Abdomen soft.  Mild nonfocal TTP, voluntary guarding. No ascites, distension, hepatosplenomegaly.   MSK: No deformities or effusions.  No cyanosis or clubbing. Neuro: Cranial nerves normal.  Sensation intact to light touch. Speech is fluent.  Muscle strength 5/5 and symmetric.    Psych: Sensorium intact and responding to questions, attention normal.  Behavior appropriate.  Affect normal.  Judgment and insight appear normal.     Labs on Admission:  I have personally reviewed following labs and imaging studies: CBC: Recent Labs  Lab 04/24/17 2337 04/25/17 1200  WBC 8.8 12.0*  HGB 15.9* 14.4  HCT 45.9 41.6  MCV 66.7* 65.1*  PLT 432* 349   Basic Metabolic Panel: Recent Labs  Lab 04/24/17 2337 04/25/17 1200  NA 123* 139  K 6.4* 3.7  CL 85* 109  CO2 12* 18*  GLUCOSE 1,018* 191*  BUN 27* 19  CREATININE 1.30* 0.69  CALCIUM 10.1 8.8*   GFR: Estimated Creatinine Clearance: 71.7 mL/min (by C-G formula based on SCr of 0.69 mg/dL).  Liver Function Tests: No results for input(s): AST, ALT, ALKPHOS, BILITOT, PROT, ALBUMIN in the last 168 hours. No results for input(s): LIPASE, AMYLASE in the last 168 hours. No results for input(s): AMMONIA in the last 168 hours. Coagulation Profile: No results for input(s): INR, PROTIME in the last 168 hours. Cardiac Enzymes: No results for input(s): CKTOTAL, CKMB, CKMBINDEX, TROPONINI in the last 168 hours. BNP (last 3 results) No results for input(s): PROBNP in the last 8760 hours. HbA1C: No results for input(s): HGBA1C in the last 72 hours. CBG: Recent Labs  Lab 04/25/17 1216 04/25/17 1314 04/25/17 1439 04/25/17 1551 04/25/17 1706  GLUCAP 163* 160* 152* 151* 163*   Lipid Profile: No results for input(s): CHOL, HDL, LDLCALC, TRIG, CHOLHDL, LDLDIRECT in the last 72 hours. Thyroid Function Tests: No results for input(s): TSH, T4TOTAL, FREET4, T3FREE, THYROIDAB in the last 72 hours. Anemia Panel: No results for  input(s): VITAMINB12, FOLATE, FERRITIN, TIBC, IRON, RETICCTPCT in the last 72 hours. Sepsis Labs: Invalid input(s): PROCALCITONIN, LACTICACIDVEN No results found for this or any previous visit (from the past 240 hour(s)).       Radiological Exams on Admission: Personally reviewed CT abdomen reprot: Ct Abdomen Pelvis W Contrast  Result Date: 04/25/2017 CLINICAL DATA:  Increased fatigue and poor appetite. EXAM: CT ABDOMEN AND PELVIS WITH CONTRAST TECHNIQUE: Multidetector CT imaging of the abdomen and pelvis was performed using the standard protocol following bolus administration of intravenous contrast. CONTRAST:  80 cc ISOVUE-300 IOPAMIDOL (ISOVUE-300) INJECTION 61% COMPARISON:  None. FINDINGS: Lower chest: Normal size heart.  Clear lung bases. Hepatobiliary: No focal liver abnormality is seen. No gallstones, gallbladder wall thickening, or biliary dilatation. Pancreas: Unremarkable. No pancreatic ductal dilatation or surrounding inflammatory changes. Spleen: Normal in size without focal abnormality. Adrenals/Urinary Tract: Adrenal glands are unremarkable. Kidneys are normal, without renal calculi, focal lesion, or hydronephrosis. Bladder is unremarkable. Stomach/Bowel: Physiologic distention of the stomach with normal small bowel rotation. No bowel obstruction or inflammation. A significant amount of retained stool is seen throughout the colon without  obstruction or inflammation. Appendix is normal. Vascular/Lymphatic: No significant vascular findings are present. No enlarged abdominal or pelvic lymph nodes. Reproductive: Uterus and bilateral adnexa are unremarkable. Other: No free air nor free fluid. Musculoskeletal: No acute or significant osseous findings. IMPRESSION: Normal appendix. Increased colonic stool burden. No bowel obstruction or inflammation. Electronically Signed   By: Tollie Ethavid  Kwon M.D.   On: 04/25/2017 04:00        Assessment/Plan Principal Problem:   DKA (diabetic ketoacidoses)  (HCC)  1. New onset diabetes:    -Continue insulin drip - BMP every 4 -Continue IV fluids - Consult to nutrition and diabetes education   2.  Hyperkalemia:  -Monitor with serial BMPs  3.  Pseudohyponatremia:  Will correct with glucose.  4. Acute kidney injury:  Baseline Cr 0.6, now more than doubled given DKA. -IV fluids and monitor      DVT prophylaxis: Lovenox  Code Status: FULL  Family Communication: mother at bedside  Disposition Plan: Anticipate IV fluids, IV insulin.  Transition to subq insulin, then consult to CM for assitasnce coordinating outpatietn care. Consults called: None Admission status: INPATIENT    Medical decision making: Patient seen at 5:27 PM on 04/25/2017.  What exists of the patient's chart was reviewed in depth and summarized above.  Clinical condition: improving.        Alberteen SamChristopher P Danford [facility] Pager [phone]

## 2017-04-25 NOTE — ED Notes (Signed)
Pt's CBG result was 350. Informed Magda PaganiniAudrey - RN.

## 2017-04-25 NOTE — ED Notes (Signed)
Gave pt Diet Sprite, per Lisabeth PickKaleigh - RN.

## 2017-04-25 NOTE — ED Notes (Signed)
Rate/dose change verified with Vernon PreyMike P, RN

## 2017-04-25 NOTE — ED Notes (Signed)
CBG: 152. RN notified. 

## 2017-04-25 NOTE — ED Notes (Signed)
Phlebotomy at bedside obtaining labs 

## 2017-04-25 NOTE — ED Notes (Signed)
Pt walked with steady gait to the bathroom.

## 2017-04-25 NOTE — ED Notes (Signed)
Pt's CBG result was 255. Informed Magda PaganiniAudrey - RN.

## 2017-04-25 NOTE — ED Notes (Signed)
Attempted report x1. 

## 2017-04-25 NOTE — ED Notes (Signed)
Gave pt Diet Ginger Ale, per Magda PaganiniAudrey - RN.

## 2017-04-25 NOTE — ED Notes (Signed)
Pt's CBG result was 294. Informed Magda PaganiniAudrey - RN.

## 2017-04-26 DIAGNOSIS — E1069 Type 1 diabetes mellitus with other specified complication: Secondary | ICD-10-CM | POA: Diagnosis present

## 2017-04-26 DIAGNOSIS — N179 Acute kidney failure, unspecified: Secondary | ICD-10-CM | POA: Diagnosis present

## 2017-04-26 DIAGNOSIS — E1010 Type 1 diabetes mellitus with ketoacidosis without coma: Secondary | ICD-10-CM | POA: Diagnosis present

## 2017-04-26 LAB — BASIC METABOLIC PANEL
ANION GAP: 10 (ref 5–15)
BUN: 15 mg/dL (ref 6–20)
CHLORIDE: 106 mmol/L (ref 101–111)
CO2: 19 mmol/L — AB (ref 22–32)
Calcium: 8.3 mg/dL — ABNORMAL LOW (ref 8.9–10.3)
Creatinine, Ser: 0.57 mg/dL (ref 0.44–1.00)
GFR calc non Af Amer: >60 mL/min (ref >60)
Glucose, Bld: 258 mg/dL — ABNORMAL HIGH (ref 65–99)
POTASSIUM: 4 mmol/L (ref 3.5–5.1)
Sodium: 135 mmol/L (ref 135–145)

## 2017-04-26 LAB — GLUCOSE, CAPILLARY
GLUCOSE-CAPILLARY: 172 mg/dL — AB (ref 65–99)
GLUCOSE-CAPILLARY: 216 mg/dL — AB (ref 65–99)
GLUCOSE-CAPILLARY: 336 mg/dL — AB (ref 65–99)
Glucose-Capillary: 118 mg/dL — ABNORMAL HIGH (ref 65–99)
Glucose-Capillary: 254 mg/dL — ABNORMAL HIGH (ref 65–99)
Glucose-Capillary: 289 mg/dL — ABNORMAL HIGH (ref 65–99)
Glucose-Capillary: 384 mg/dL — ABNORMAL HIGH (ref 65–99)

## 2017-04-26 LAB — HIV ANTIBODY (ROUTINE TESTING W REFLEX): HIV Screen 4th Generation wRfx: NONREACTIVE

## 2017-04-26 MED ORDER — INSULIN GLARGINE 100 UNIT/ML ~~LOC~~ SOLN
10.0000 [IU] | Freq: Every day | SUBCUTANEOUS | Status: DC
  Administered 2017-04-26 – 2017-04-27 (×2): 10 [IU] via SUBCUTANEOUS
  Filled 2017-04-26 (×2): qty 0.1

## 2017-04-26 MED ORDER — INSULIN ASPART 100 UNIT/ML ~~LOC~~ SOLN
0.0000 [IU] | Freq: Three times a day (TID) | SUBCUTANEOUS | Status: DC
  Administered 2017-04-26: 3 [IU] via SUBCUTANEOUS
  Administered 2017-04-26: 8 [IU] via SUBCUTANEOUS
  Administered 2017-04-26: 11 [IU] via SUBCUTANEOUS
  Administered 2017-04-27: 5 [IU] via SUBCUTANEOUS
  Administered 2017-04-27: 3 [IU] via SUBCUTANEOUS

## 2017-04-26 NOTE — Progress Notes (Signed)
Pt gave insuline injection fine this time.  Juliano Mceachin,RN

## 2017-04-26 NOTE — Progress Notes (Signed)
Pt practiced giving insulin shot to self.  Noted some of the insuline leaked when she was injecting.  Will continue to educate her on fingerstick and insulin administration.  Hinton DyerYoko Kedrick Mcnamee, RN

## 2017-04-26 NOTE — Progress Notes (Signed)
Samuel Germanyxtpaged Blount, NP regarding q1h blood glucose check. Received a call back @ approximately 8:40pm. Received a verbal order to discontinue q1h CBG. Per Bruna PotterBlount, NP since pt is no longer in insulin gtt, she can be a q4h blood sugar check per hospital DKA protocol. Will monitor.

## 2017-04-26 NOTE — Progress Notes (Signed)
Inpatient Diabetes Program Recommendations  AACE/ADA: New Consensus Statement on Inpatient Glycemic Control (2015)  Target Ranges:  Prepandial:   less than 140 mg/dL      Peak postprandial:   less than 180 mg/dL (1-2 hours)      Critically ill patients:  140 - 180 mg/dL   Lab Results  Component Value Date   GLUCAP 172 (H) 04/26/2017    Review of Glycemic Control Results for [name], [name] (MRN [id number]) as of 04/26/2017 11:58  Ref. Range 04/26/2017 00:27 04/26/2017 04:16 04/26/2017 07:51 04/26/2017 11:49  Glucose-Capillary Latest Ref Range: 65 - 99 mg/dL 409216 (H) 811254 (H) 914289 (H) 172 (H)   Diabetes history: New onset type 1 DM Outpatient Diabetes medications: none Current orders for Inpatient glycemic control: Novolog 0-15 units TIDAC, Novolog 0-5 units QHS, Novolog 3 units TIDAC, Lantus 10 units QHS  Inpatient Diabetes Program Recommendations:    A1C-Will await result, lab to be drawn in the AM. Would recommend adding on a C-Peptide to confirm lack of insulin production.  Patient will need extensive teaching. Will place consult for dietitian, case manager, patient videos and outpatient education. RN needs to reinforce education for insulin administration.  Will plan to see patient today to assess patient's preference for insulin administration upon discharge.  Thanks, Lujean RaveLauren Torrence Branagan, MSN, RNC-OB Diabetes Coordinator [phone] (8a-5p)

## 2017-04-26 NOTE — Progress Notes (Addendum)
Initial Nutrition Assessment / Education Note  DOCUMENTATION CODES:   Underweight  INTERVENTION:   Pt would likely benefit from small, more frequent meals; Snacks BID ordered.  NUTRITION DIAGNOSIS:   Food and nutrition related knowledge deficit related to other (see comment)(new Type 1 DM diagnosis) as evidenced by per patient/family report.  GOAL:   Patient will meet greater than or equal to 90% of their needs  MONITOR:   PO intake, Weight trends, Labs, I & O's  REASON FOR ASSESSMENT:   Consult, Malnutrition Screening Tool Diet education  ASSESSMENT:   22 y.o. female without significant PMH who presents with several days malaise, abdominal pain, vomiting. Pt with DKA, new onset type 1 DM.  Pt recently had her wisdom teeth removed (pt reported 3/20); Her family reports brining her to the ED after an episode of fainting.   Pt/family reports pt has been eating less/differently due to pt's recent tooth extraction. Pt also had N/V in the days leading up to admission. Prior to this wisdom tooth extraction, pt reports having a good appetite and eating well. Pt usually consumes breakfast and dinner with snacks throughout the day.   RD consulted for new onset DM education.  Intern provided "Type 1 Diabetes Nutrition Therapy" handout from the Academy of [employer]. Discussed different food groups and their effects on blood sugar, emphasizing carbohydrate-containing foods. Provided list of carbohydrates and recommended serving sizes of common foods.  Discussed importance of controlled and consistent carbohydrate intake throughout the day. Provided temporary goal of 45-60g of carbohydrates at meals 3x/day. Recommended protein intake with carbohydrates at meals and snacks. Strongly encouraged pt to start eating lunch and adding in snacks as appropriate. Provided examples of ways to balance meals/snacks and encouraged intake of high-fiber, whole grain complex carbohydrates.  Teach back method used. Expect fair compliance.  Pt/family seem overwhelmed with the new diagnosis but eager to learn. Family interested in outpatient diabetes education. Per Diabetes Coordinator Notes, referral made.   Per RN, pt ate 100% of dinner last night and 30-50% of breakfast. Pt reports dislike of the food but was amenable to receiving snacks BID. Pt likes Ensure and has been drinking them.   Pt reports a UBW of 113lb about 6months ago. Pt/family reports pt had been trying to gain wt but has been losing wt instead. Pt reports being 100lbs 2 weeks ago; this translates to a 2% loss which is significant for timeframe. Pt has lost 19% from reported UBW 6months ago, which is also significant for time frame.   Medications: Amoxicillin, insulin.  Labs: Ca (L;8.3)  CBG (last 3)  Recent Labs    04/26/17 0416 04/26/17 0751 04/26/17 1149  GLUCAP 254* 289* 172*   NUTRITION - FOCUSED PHYSICAL EXAM:    Most Recent Value  Orbital Region  No depletion  Upper Arm Region  Moderate depletion  Thoracic and Lumbar Region  Moderate depletion  Buccal Region  No depletion  Temple Region  No depletion  Clavicle Bone Region  Mild depletion  Clavicle and Acromion Bone Region  No depletion  Scapular Bone Region  No depletion  Dorsal Hand  No depletion  Patellar Region  Mild depletion  Anterior Thigh Region  Mild depletion  Posterior Calf Region  Mild depletion  Edema (RD Assessment)  None  Hair  Unable to assess  Eyes  Reviewed  Mouth  Reviewed  Skin  Reviewed  Nails  Reviewed     Pt is thin at baseline. Noted some fat depletions  in the upper body as outlined above.  Diet Order:  Diet Carb Modified Fluid consistency: Thin; Room service appropriate? Yes  EDUCATION NEEDS:   Education needs have been addressed  Skin:  Skin Assessment: Reviewed RN Assessment  Last BM:  3/23  Height:   Ht Readings from Last 1 Encounters:  04/25/17 4\' 11"  (1.499 m)    Weight:   Wt Readings  from Last 1 Encounters:  04/26/17 91 lb 6.4 oz (41.5 kg)   Wt Readings from Last 3 Encounters:  04/26/17 91 lb 6.4 oz (41.5 kg)  04/23/15 118 lb 11.2 oz (53.8 kg)     Ideal Body Weight:  44.7 kg  BMI:  Body mass index is 18.46 kg/m.  Estimated Nutritional Needs:   Kcal:  1600-1800  Protein:  60-70  Fluid:  >1.6L    Nova Schmuhl, MS, Dietetic Intern Pager # [phone]

## 2017-04-26 NOTE — Care Management Note (Addendum)
Case Management Note  Patient Details  Name: Julia PlumbKavisha Wurtz MRN: [id number] Date of Birth: [birth year]  Subjective/Objective: Pt presented for DKA-pt with Nausea, Blurred Vision & Abdominal Pain. Pt has PCP- Gwinda PasseMichelle Edwards at the [facility] on [address] Pt uses [facility] on [address] and she has Medicaid- medications range in cost from $1.50-$3.00. Pt will need Rx for Glucose Meter, Strips and lancets. MD is aware. Diabetes Coordinator to consult with patient prior to d/c.                   Action/Plan: CM did call [facility] to schedule follow up appointment. Appointment placed on AVS. No further needs from CM at this time.  Expected Discharge Date:                 Expected Discharge Plan:  Home/Self Care  In-House Referral:  NA  Discharge planning Services  CM Consult, Follow-up appt scheduled, Indigent [facility], Medication Assistance  Post Acute Care Choice:  NA Choice offered to:  NA  DME Arranged:  N/A DME Agency:  NA  HH Arranged:  NA HH Agency:  NA  Status of Service:  Completed, signed off  If discussed at Long Length of Stay Meetings, dates discussed:    Additional Comments: 1219 04-27-17 [facility] [place]. No further needs from CM at this time.    1417 04-26-17 Tomi BambergerBrenda Graves-Bigelow, RN,BSN [phone]- CM will fax D/c summary to [facility] [place] @ [phone]. Office # is [phone] [facility].    1243 04-26-17 Tomi BambergerBrenda Graves-Bigelow, RN,BSN [phone] CM did receive a new consult for endocrinologist- Unit Secretary to call and schedule to appointment-Unable to schedule new appointment. Medical Consent Obtained to fax information. [facility] to see if pt is eligible for appointment. Office to call patient with an appointment time. No further needs from CM at this time.    Gala LewandowskyGraves-Bigelow, Bawi Lakins Kaye, RN 04/26/2017,  11:38 AM

## 2017-04-26 NOTE — Discharge Instructions (Signed)
Blood Glucose Monitoring, Adult Monitoring your blood sugar (glucose) helps you manage your diabetes. It also helps you and your health care provider determine how well your diabetes management plan is working. Blood glucose monitoring involves checking your blood glucose as often as directed, and keeping a record (log) of your results over time. Why should I monitor my blood glucose? Checking your blood glucose regularly can:  Help you understand how food, exercise, illnesses, and medicines affect your blood glucose.  Let you know what your blood glucose is at any time. You can quickly tell if you are having low blood glucose (hypoglycemia) or high blood glucose (hyperglycemia).  Help you and your health care provider adjust your medicines as needed.  When should I check my blood glucose? Follow instructions from your health care provider about how often to check your blood glucose. This may depend on:  The type of diabetes you have.  How well-controlled your diabetes is.  Medicines you are taking.  If you have type 1 diabetes:  Check your blood glucose at least 2 times a day.  Also check your blood glucose: ? Before every insulin injection. ? Before and after exercise. ? Between meals. ? 2 hours after a meal. ? Occasionally between 2:00 a.m. and 3:00 a.m., as directed. ? Before potentially dangerous tasks, like driving or using heavy machinery. ? At bedtime.  You may need to check your blood glucose more often, up to 6-10 times a day: ? If you use an insulin pump. ? If you need multiple daily injections (MDI). ? If your diabetes is not well-controlled. ? If you are ill. ? If you have a history of severe hypoglycemia. ? If you have a history of not knowing when your blood glucose is getting low (hypoglycemia unawareness). If you have type 2 diabetes:  If you take insulin or other diabetes medicines, check your blood glucose at least 2 times a day.  If you are on intensive  insulin therapy, check your blood glucose at least 4 times a day. Occasionally, you may also need to check between 2:00 a.m. and 3:00 a.m., as directed.  Also check your blood glucose: ? Before and after exercise. ? Before potentially dangerous tasks, like driving or using heavy machinery.  You may need to check your blood glucose more often if: ? Your medicine is being adjusted. ? Your diabetes is not well-controlled. ? You are ill. What is a blood glucose log?  A blood glucose log is a record of your blood glucose readings. It helps you and your health care provider: ? Look for patterns in your blood glucose over time. ? Adjust your diabetes management plan as needed.  Every time you check your blood glucose, write down your result and notes about things that may be affecting your blood glucose, such as your diet and exercise for the day.  Most glucose meters store a record of glucose readings in the meter. Some meters allow you to download your records to a computer. How do I check my blood glucose? Follow these steps to get accurate readings of your blood glucose: Supplies needed   Blood glucose meter.  Test strips for your meter. Each meter has its own strips. You must use the strips that come with your meter.  A needle to prick your finger (lancet). Do not use lancets more than once.  A device that holds the lancet (lancing device).  A journal or log book to write down your results. Procedure  meter has its own strips. You must use the strips that come with your meter.   A needle to prick your finger (lancet). Do not use lancets more than once.   A device that holds the lancet (lancing device).   A journal or log book to write down your results.  Procedure   Wash your hands with soap and water.   Prick the side of your finger (not the tip) with the lancet. Use a different finger each time.   Gently rub the finger until a small drop of blood appears.   Follow instructions that come with your meter for inserting the test strip, applying blood to the strip, and using your blood glucose meter.   Write down your result and any notes.  Alternative testing sites   Some meters allow you to use areas of your body other than your finger  (alternative sites) to test your blood.   If you think you may have hypoglycemia, or if you have hypoglycemia unawareness, do not use alternative sites. Use your finger instead.   Alternative sites may not be as accurate as the fingers, because blood flow is slower in these areas. This means that the result you get may be delayed, and it may be different from the result that you would get from your finger.   The most common alternative sites are:  ? Forearm.  ? Thigh.  ? Palm of the hand.  Additional tips   Always keep your supplies with you.   If you have questions or need help, all blood glucose meters have a 24-hour "hotline" number that you can call. You may also contact your health care provider.   After you use a few boxes of test strips, adjust (calibrate) your blood glucose meter by following instructions that came with your meter.  This information is not intended to replace advice given to you by your health care provider. Make sure you discuss any questions you have with your health care provider.  Document Released: 01/22/2003 Document Revised: 08/09/2015 Document Reviewed: 07/01/2015  Elsevier Interactive Patient Education  2017 [employer]    Correction Insulin  Correction insulin, also called corrective insulin or a supplemental dose, is a small amount of insulin that can be used to lower your blood sugar (glucose) if it is too high. You may be instructed to check your blood glucose at certain times of the day and to use correction insulin as needed to lower your blood glucose to your target range.  Correction insulin is primarily used as part of diabetes management. It may also be prescribed for people who do not have diabetes.  What is a correction scale?  A correction scale, also called a sliding scale, is prescribed by your health care provider to help you determine when you need correction insulin. Your correction scale is based on your individual treatment goals, and it has two  parts:   Ranges of blood glucose levels.   How much correction insulin to give yourself if your blood sugar falls within a certain range.    If your blood glucose is in your desired range, you will not need correction insulin and you should take your normal insulin dose.  What type of insulin do I need?  Your health care provider may prescribe rapid-acting or short-acting insulin for you to use as correction insulin.  Rapid-acting insulin:   Starts working quickly, in as little as 5 minutes.   Can last for 3-6 hours.     Works well when taken right before a meal to quickly lower blood glucose.  Short-acting insulin:   Starts working in about 30 minutes.   Can last for 6-8 hours.   Should be taken about 30 minutes before you start eating a meal.  Talk with your health care provider or pharmacist about which type of correction insulin to take and when to take it. If you use insulin to control your diabetes, you should use correction insulin in addition to the longer-acting (basal) insulin that you normally use.  How do I manage my blood glucose with correction insulin?  Giving a correction dose   Check your blood glucose as directed by your health care provider.   Use your correction scale to find the range that your blood glucose is in.   Identify the units of insulin that match your blood glucose range.   Make sure you have food available that you can eat in the next 15-30 minutes, after your correction dose.   Give yourself the dose of correction insulin that your health care provider has prescribed in your correction scale. Always make sure you are using the right type of insulin.  ? If your correction insulin is rapid-acting, start eating a meal within 15 minutes after your correction dose to keep your blood glucose from getting too low.  ? If your correction insulin is short-acting, start eating a meal within 30 minutes after your correction dose to keep your blood glucose from getting too  low.  Keeping a blood glucose log   Write down your blood glucose test results and the amount of insulin that you give yourself. Do this every time you check blood glucose or take insulin. Bring this log with you to your medical visits. This information will help your health care provider to manage your medicines.   Note anything that may affect your blood glucose, such as:  ? Changes in normal exercise or activity.  ? Changes in your normal schedule, such as changes in your sleep routine, going on vacation, changing your diet, or holidays.  ? New over-the-counter or prescription medicines.  ? Illness, stress, or anxiety.  ? Changes in the time that you took your medicine or insulin.  ? Changes in your meals, such as skipping a meal, having a late meal, or dining out.  ? Eating things that may affect blood glucose, such as snacks, meal portions that are larger than normal, drinks that contain sugar, or eating less than usual.  What do I need to know about hyperglycemia and hypoglycemia?  What is hyperglycemia?  Hyperglycemia, also called high blood glucose, occurs when blood glucose is too high. Make sure you know the early signs of hyperglycemia, such as:   Increased thirst.   Hunger.   Feeling very tired.   Needing to urinate more often than usual.   Blurry vision.    What is hypoglycemia?  Hypoglycemia is also called low blood glucose. Be aware of "stacking" your insulin doses. This happens when you correct a high blood glucose by giving yourself extra insulin too soon after a previous correction dose or mealtime dose. This may cause you to have too much insulin in your body and may put you at risk for hypoglycemia. Hypoglycemia occurs with a blood glucose level at or below 70 mg/dL (3.9 mmol/L).  It is important to know the symptoms of hypoglycemia and treat it right away. Always have a 15-gram rapid-acting carbohydrate snack with you to treat low blood   glucose. Family members and close friends should  also know the symptoms and should understand how to treat hypoglycemia, in case you are not able to treat yourself.  What are the symptoms of hypoglycemia?  Hypoglycemia symptoms can include:   Hunger.   Anxiety.   Sweating and feeling clammy.   Confusion.   Dizziness or light-headedness.   Sleepiness.   Nausea.   Increased heart rate.   Headache.   Blurry vision.   Jerky movements that you cannot control (seizure).   Nightmares.   Tingling or numbness around the mouth, lips, or tongue.   A change in speech.   Decreased ability to concentrate.   A change in coordination.   Restless sleep.   Tremors or shakes.   Fainting.   Irritability.    How do I treat hypoglycemia?  If you are alert and able to swallow safely, follow the 15:15 rule:   Take 15 grams of a rapid-acting carbohydrate. Rapid-acting options include:  ? 1 tube of glucose gel.  ? 3 glucose pills.  ? 6-8 pieces of hard candy.  ? 4 oz (120 mL) of fruit juice.  ? 4 oz (120 mL) of regular (not diet) soda.   Check your blood glucose 15 minutes after you take the carbohydrate.  ? If the repeat blood glucose level is still at or below 70 mg/dL (3.9 mmol/L), take 15 grams of a carbohydrate again.  ? If your blood glucose level does not increase above 70 mg/dL (3.9 mmol/L) after 3 tries, seek emergency medical care.   After your blood glucose level returns to normal, eat a meal or a snack within 1 hour.    How do I treat severe hypoglycemia?  Severe hypoglycemia is when your blood glucose level is at or below 54 mg/dL (3 mmol/L). Severe hypoglycemia is an emergency. Do not wait to see if the symptoms will go away. Get medical help right away. Call your local emergency services (911 in the [place] Do not drive yourself to the hospital.  If you have severe hypoglycemia and you cannot eat or drink, you may need an injection of glucagon. A family member or close friend should learn how to check your blood glucose and how to give you a glucagon  injection. Ask your health care provider if you need to have an emergency glucagon injection kit available.  Severe hypoglycemia may need to be treated in a hospital. The treatment may include getting glucose through an IV tube. You may also need treatment for the cause of your hypoglycemia.  Why do I need correction insulin if I do not have diabetes?  If you do not have diabetes, your health care provider may prescribe insulin because:   Keeping your blood glucose in the target range is important for your overall health.   You are taking medicines that cause your blood glucose to be higher than normal.    Contact a health care provider if:   You develop low blood glucose that you are not able to treat yourself.   You have high blood glucose that you are not able to correct with correction insulin.   Your blood glucose is often too low.   You used emergency glucagon to treat low blood glucose.  Get help right away if:   You become unresponsive. If this happens, someone else should call emergency services (911 in the [place] right away.   Your blood glucose is lower than 54 mg/dL (3.0 mmol/L).   You   case you are not able to treat yourself. This information is not intended to replace advice given to you by your health care provider. Make sure you discuss any questions you have with your health care provider. Document Released: 06/12/2010 Document Revised: 10/18/2015 Document Reviewed: 10/18/2015 Elsevier Interactive Patient Education  2018 [employer].   Complementary and Alternative Medical Therapies for Diabetes Complementary and alternative medical therapies are treatments that are different from typical medical treatments (Western treatments). "Complementary" means that the therapy is used with Western treatments. "Alternative" means that the therapy is used instead of Western treatments. Are these therapies safe? Some of these therapies are usually safe. Others may be harmful. Often, there is not enough research to show how safe or effective a therapy is. If you want to try a complementary or alternative therapy, talk with your health care provider to make sure it is safe. What alternative or complementary therapies are used to treat diabetes? Acupuncture Acupuncture is the insertion of needles into certain places on the skin. This is done by a professional. It is often used to relieve long-term (chronic) pain, especially of the bones and joints. It may help you if you have painful nerve damage. Biofeedback Biofeedback helps you to become more aware of your body's response to pain. It also helps you to learn ways of dealing with pain. Biofeedback is about relaxing and reducing stress. An example of a biofeedback technique is guided imagery. This involves creating peaceful images in your mind. Chromium Chromium is a substance that can help improve how insulin works in the body.  Chromium is in many foods, including whole grains, nuts, and egg yolks. Chromium may also be taken as a supplement. Taking chromium supplements may help to control diabetes, especially if you have a lack of chromium (deficiency) in your body. However, research has not proven this. If you have kidney problems, you should be careful with chromium supplements. American ginseng American ginseng is an herb that may lower glucose levels. It may also help lower A1C levels. More research is needed before recommendations for ginseng use can be made. Magnesium Magnesium is a mineral found in many foods, such as whole grains, nuts, and green leafy vegetables. Having low magnesium levels may make controlling blood glucose more difficult for people who have type 2 diabetes. Low magnesium levels may also contribute to certain diabetes complications. Getting more magnesium and eating a high-fiber diet may reduce the risk of developing type 2 diabetes. Vanadium Vanadium is a compound found in small amounts in certain plants and animals. Some studies show that it improves glucose levels in animals with diabetes. In one study, people with diabetes were able to lower their insulin dosage when taking vanadium. More research about side effects and safe dosage levels is needed. Cinnamon Cinnamon may decrease insulin resistance, increase insulin production, and lower blood glucose levels. It may work best when used with diabetes medicines. Fenugreek Fenugreek is an herb whose seeds are often used in cooking. It may help lower blood glucose by decreasing carbohydrate absorption and increasing insulin production. Summary  Talk with your health care provider about complementary or alternative therapy for you. Some therapies may be appropriate for you, but others may cause side effects.  Follow your diabetes care plan as prescribed. This information is not intended to replace advice given to you by your health care provider.  Make sure you discuss any questions you have with your health care provider. Document Released: 11/16/2006 Document Revised: 02/05/2016 Document Reviewed: 02/05/2016  [employer] certified [employer] Patient Education  [employer].

## 2017-04-26 NOTE — Progress Notes (Signed)
PROGRESS NOTE    Maryalyce Sanjuan  WUJ:[id number] DOB: 17-Oct-1995 DOA: 04/25/2017 PCP: Patient, No Pcp Per      Brief Narrative:  Tawnie Ehresman is a 22 y.o. female with no significant past medical history who presents with several days malaise, abdominal pain, vomiting.  Found in ER to have glucose >1000 mg/dL, bicarb 12, Cr doubled from baseline, anion gap 26, pH 7.2.  Started on insulin and fluids for new onset DKA.     Assessment & Plan:   New onset likely type 1 diabetes Transitioned overnight to subQ insulin.  BGs somewhat high.  Overwhelmed by diabetes teaching today, but mom is optimistic that she will do well. -Continue Lantus, increase dose to 10 units each morning -Continue Novolog 3 units TID -SSI while in the hospital -Appreciate diabetes coordinator -Defer c-peptide, anti-GAD ABs etc to outpatient  -At discharge, patient is being arranged with Dr. Leslie Dales from [facility] here in [place]. -Diabetes education will follow up if patient is able to be discharged with pen insulin (lantus/Novolog) vs vial -Diabetes coordinator recommends fixed dose schedule (I.e. No carb counting or sliding scale) at discharge, to start with something simple    Recent wisdom tooth extraction -Finish home amoxicillin today  AKI Hyperkalemia Hyponatremia All resolved.      DVT prophylaxis: Heparin Code Status: FULL Family Communication: Mother and father at bedside MDM and disposition Plan: The below labs and imaging reports were reviewed.  The patient's status is clinically improved.  She requires extensive teaching and care coordination for establishing adequate initial care of her new onset diabetes.    Diabetes coordinator will assess patient early tomorrow.  Plan for home with Lantus 10, Novolog 3 TID and no corrections.  WIll have outpatient follow up.  DM coord to follow up re: insulin pen coverage.     Subjective: Feeling better.  No abdominal pain, no vomiting, no  confusion, no weakness.  No fever, no dysuria, no cough.  Objective: Vitals:   04/26/17 0411 04/26/17 0837 04/26/17 1153 04/26/17 1619  BP: 118/71 119/65 124/73 111/65  Pulse: 84 88 85 88  Resp: 16 16 16 16   Temp: 99.2 F (37.3 C) 98.6 F (37 C) 98.2 F (36.8 C) 98.7 F (37.1 C)  TempSrc: Oral Oral Oral Oral  SpO2: 99%     Weight: 41.5 kg (91 lb 6.4 oz)     Height:        Intake/Output Summary (Last 24 hours) at 04/26/2017 1909 Last data filed at 04/26/2017 1807 Gross per 24 hour  Intake 841.62 ml  Output 600 ml  Net 241.62 ml   Filed Weights   04/24/17 2318 04/25/17 1600 04/26/17 0411  Weight: 45.4 kg (100 lb) 41.2 kg (90 lb 14.4 oz) 41.5 kg (91 lb 6.4 oz)    Examination: General appearance: Thin adult female, alert and in no acute distress.   HEENT: Anicteric, conjunctiva pink, lids and lashes normal. No nasal deformity, discharge, epistaxis.  Lips moist.   Skin: Warm and dry.  No jaundice.  No suspicious rashes or lesions. Cardiac: RRR, nl S1-S2, no murmurs appreciated.  Capillary refill is brisk.  JVP normal  No LE edema.  Radial pulses 2+ and symmetric. Respiratory: Normal respiratory rate and rhythm.  CTAB without rales or wheezes. Abdomen: Abdomen soft.  No TTP. No ascites, distension, hepatosplenomegaly.   MSK: No deformities or effusions. Neuro: Awake and alert.  EOMI, moves all extremities. Speech fluent.    Psych: Sensorium intact and responding to questions,  attention normal. Affect blunted.  Judgment and insight appear normal.    Data Reviewed: I have personally reviewed following labs and imaging studies:  CBC: Recent Labs  Lab 04/24/17 2337 04/25/17 1200  WBC 8.8 12.0*  HGB 15.9* 14.4  HCT 45.9 41.6  MCV 66.7* 65.1*  PLT 432* 349   Basic Metabolic Panel: Recent Labs  Lab 04/24/17 2337 04/25/17 1200 04/26/17 0409  NA 123* 139 135  K 6.4* 3.7 4.0  CL 85* 109 106  CO2 12* 18* 19*  GLUCOSE 1,018* 191* 258*  BUN 27* 19 15  CREATININE 1.30*  0.69 0.57  CALCIUM 10.1 8.8* 8.3*   GFR: Estimated Creatinine Clearance: 72.3 mL/min (by C-G formula based on SCr of 0.57 mg/dL). Liver Function Tests: No results for input(s): AST, ALT, ALKPHOS, BILITOT, PROT, ALBUMIN in the last 168 hours. No results for input(s): LIPASE, AMYLASE in the last 168 hours. No results for input(s): AMMONIA in the last 168 hours. Coagulation Profile: No results for input(s): INR, PROTIME in the last 168 hours. Cardiac Enzymes: No results for input(s): CKTOTAL, CKMB, CKMBINDEX, TROPONINI in the last 168 hours. BNP (last 3 results) No results for input(s): PROBNP in the last 8760 hours. HbA1C: No results for input(s): HGBA1C in the last 72 hours. CBG: Recent Labs  Lab 04/26/17 0027 04/26/17 0416 04/26/17 0751 04/26/17 1149 04/26/17 1615  GLUCAP 216* 254* 289* 172* 336*   Lipid Profile: No results for input(s): CHOL, HDL, LDLCALC, TRIG, CHOLHDL, LDLDIRECT in the last 72 hours. Thyroid Function Tests: No results for input(s): TSH, T4TOTAL, FREET4, T3FREE, THYROIDAB in the last 72 hours. Anemia Panel: No results for input(s): VITAMINB12, FOLATE, FERRITIN, TIBC, IRON, RETICCTPCT in the last 72 hours. Urine analysis:    Component Value Date/Time   COLORURINE YELLOW 04/24/2017 2320   APPEARANCEUR HAZY (A) 04/24/2017 2320   LABSPEC 1.024 04/24/2017 2320   PHURINE 6.0 04/24/2017 2320   GLUCOSEU >=500 (A) 04/24/2017 2320   HGBUR LARGE (A) 04/24/2017 2320   BILIRUBINUR NEGATIVE 04/24/2017 2320   KETONESUR 20 (A) 04/24/2017 2320   PROTEINUR 30 (A) 04/24/2017 2320   NITRITE NEGATIVE 04/24/2017 2320   LEUKOCYTESUR TRACE (A) 04/24/2017 2320   Sepsis Labs: @LABRCNTIP (procalcitonin:4,lacticacidven:4)  ) Recent Results (from the past 240 hour(s))  MRSA PCR Screening     Status: None   Collection Time: 04/25/17  3:49 PM  Result Value Ref Range Status   MRSA by PCR NEGATIVE NEGATIVE Final    Comment:        The GeneXpert MRSA Assay (FDA approved for  NASAL specimens only), is one component of a comprehensive MRSA colonization surveillance program. It is not intended to diagnose MRSA infection nor to guide or monitor treatment for MRSA infections. Performed at [facility], [address] [place], [place] [address]          Radiology Studies: Ct Abdomen Pelvis W Contrast  Result Date: 04/25/2017 CLINICAL DATA:  Increased fatigue and poor appetite. EXAM: CT ABDOMEN AND PELVIS WITH CONTRAST TECHNIQUE: Multidetector CT imaging of the abdomen and pelvis was performed using the standard protocol following bolus administration of intravenous contrast. CONTRAST:  80 cc ISOVUE-300 IOPAMIDOL (ISOVUE-300) INJECTION 61% COMPARISON:  None. FINDINGS: Lower chest: Normal size heart.  Clear lung bases. Hepatobiliary: No focal liver abnormality is seen. No gallstones, gallbladder wall thickening, or biliary dilatation. Pancreas: Unremarkable. No pancreatic ductal dilatation or surrounding inflammatory changes. Spleen: Normal in size without focal abnormality. Adrenals/Urinary Tract: Adrenal glands are unremarkable. Kidneys are  normal, without renal calculi, focal lesion, or hydronephrosis. Bladder is unremarkable. Stomach/Bowel: Physiologic distention of the stomach with normal small bowel rotation. No bowel obstruction or inflammation. A significant amount of retained stool is seen throughout the colon without obstruction or inflammation. Appendix is normal. Vascular/Lymphatic: No significant vascular findings are present. No enlarged abdominal or pelvic lymph nodes. Reproductive: Uterus and bilateral adnexa are unremarkable. Other: No free air nor free fluid. Musculoskeletal: No acute or significant osseous findings. IMPRESSION: Normal appendix. Increased colonic stool burden. No bowel obstruction or inflammation. Electronically Signed   By: Tollie Eth M.D.   On: 04/25/2017 04:00        Scheduled Meds: . amoxicillin  500 mg Oral TID  .  feeding supplement (ENSURE ENLIVE)  237 mL Oral BID BM  . heparin  5,000 Units Subcutaneous Q8H  . Influenza vac split quadrivalent PF  0.5 mL Intramuscular Tomorrow-1000  . insulin aspart  0-15 Units Subcutaneous TID WC  . insulin aspart  0-5 Units Subcutaneous QHS  . insulin aspart  3 Units Subcutaneous TID WC  . insulin glargine  10 Units Subcutaneous Daily  . pneumococcal 23 valent vaccine  0.5 mL Intramuscular Tomorrow-1000   Continuous Infusions: . insulin (NOVOLIN-R) infusion Stopped (04/25/17 1835)     LOS: 1 day    Time spent: 30 minutes    Alberteen Sam, MD [facility] 04/26/2017, 7:09 PM     Pager [phone] --- please page though AMION:  [email or web address] If 7PM-7AM, please contact night-coverage

## 2017-04-26 NOTE — Progress Notes (Addendum)
Inpatient Diabetes Program Recommendations  AACE/ADA: New Consensus Statement on Inpatient Glycemic Control (2015)  Target Ranges:  Prepandial:   less than 140 mg/dL      Peak postprandial:   less than 180 mg/dL (1-2 hours)      Critically ill patients:  140 - 180 mg/dL   Lab Results  Component Value Date   GLUCAP 172 (H) 04/26/2017    1200 pm  Spoke with Mom and Patient in room at bedside. Mom mentioned patient has some learning challenges and understanding issues. Patient and mom will need more simple and extensive education.   Topics covered this session: Differences between DM Type 1 and Type 2 Insulin administration via pen and left teaching materials at bedside for practice Discussed covering meals with insulin (very basic) Discussed importance of checking blood sugar  Due to task overload with patient suggested for her to check in the morning and supper/bedtime during the week on days mom is working and then on the weekends for mom to check at lunchtime. Discussed with mom how patient needs to follow up with an Endocrinologist outpatient to be managed    Mom requesting a simple schedule to be made for her and patient so patient can follow at home. Mom goes to work during the day so patient will be administering injections herself during the day.   Topics that need to be covered A1c Hypoglycemia Hyperglycemia Nutrition reinforcement The different types of insulin/when to give/why Being active/Exercise Review insulin pen administration Sick day management Reinforce how to check a CBG and when  Supplies needed at d/c: Ketone strips Novolog flex pen Lantus pen Blood glucose meter kit (order # [id number]) Insulin pen needles (order #[id number]) Glucagon kit  Thanks,  Shannon Willis RN, MSN, BC-ADM, PCCN Inpatient Diabetes Coordinator Team Pager [phone] (8a-5p) 

## 2017-04-26 NOTE — Progress Notes (Signed)
Inpatient Diabetes Program Recommendations  AACE/ADA: New Consensus Statement on Inpatient Glycemic Control (2015)  Target Ranges:  Prepandial:   less than 140 mg/dL      Peak postprandial:   less than 180 mg/dL (1-2 hours)      Critically ill patients:  140 - 180 mg/dL   DM Type 1 Learning Session 2:  Topics covered this session:  Patient has Pediatric DM type 1 educational booklet at bedside  Mom said they have practiced with the insulin pen "a little" since last visit Discussed Meals and portion sizes and covering meals with insulin  Mom states patient eats a lot of the same things. She does eat oatmeal and grits with added sugar, she also eats a lot of sweetened cereals. Discussed with mom about different sweetners instead of added sugar and to find the lesser CHO cereal that patient likes. Also discussed with mom to make sure she pre measures the oatmeal and grits out to where she knows how much the patient is eating. Discussed briefly about hypoglycemia symptoms and treatment Discussed briefly about hyperglycemia symptoms and treatment Discussed about sick day guidelines and what to eat and to still take insulin  Discussed again with mom how patient needs to follow up with an Endocrinologist outpatient to be managed  Sample schedule made for Mom and Patient. Will make changes after insulin for discharge is ordered.  Topics that need to be covered and reviewed A1c Hypoglycemia Hyperglycemia The different types of insulin/when to give/why Being active/Exercise Review insulin pen administration Sick day management reinforcement and to check glucose every 3-4 hours Reinforce how to check a CBG and when How and when to give glucagon When to test for ketones  Thanks,  Christena DeemShannon Jaysten Essner RN, MSN, BC-ADM, [facility] Inpatient Diabetes Coordinator Team Pager [phone] (8a-5p)

## 2017-04-27 DIAGNOSIS — E1310 Other specified diabetes mellitus with ketoacidosis without coma: Secondary | ICD-10-CM

## 2017-04-27 DIAGNOSIS — N179 Acute kidney failure, unspecified: Secondary | ICD-10-CM

## 2017-04-27 DIAGNOSIS — E1010 Type 1 diabetes mellitus with ketoacidosis without coma: Principal | ICD-10-CM

## 2017-04-27 LAB — BASIC METABOLIC PANEL
Anion gap: 12 (ref 5–15)
BUN: 14 mg/dL (ref 6–20)
CHLORIDE: 102 mmol/L (ref 101–111)
CO2: 23 mmol/L (ref 22–32)
CREATININE: 0.48 mg/dL (ref 0.44–1.00)
Calcium: 8.8 mg/dL — ABNORMAL LOW (ref 8.9–10.3)
GFR calc non Af Amer: >60 mL/min (ref >60)
Glucose, Bld: 244 mg/dL — ABNORMAL HIGH (ref 65–99)
Potassium: 3.8 mmol/L (ref 3.5–5.1)
SODIUM: 137 mmol/L (ref 135–145)

## 2017-04-27 LAB — HEMOGLOBIN A1C
HEMOGLOBIN A1C: 12.7 % — AB (ref 4.8–5.6)
MEAN PLASMA GLUCOSE: 317.79 mg/dL

## 2017-04-27 LAB — GLUCOSE, CAPILLARY
GLUCOSE-CAPILLARY: 186 mg/dL — AB (ref 65–99)
Glucose-Capillary: 240 mg/dL — ABNORMAL HIGH (ref 65–99)
Glucose-Capillary: 240 mg/dL — ABNORMAL HIGH (ref 65–99)

## 2017-04-27 MED ORDER — "PEN NEEDLES 3/16"" 31G X 5 MM MISC": 5 refills | Status: AC

## 2017-04-27 MED ORDER — INSULIN GLARGINE 100 UNIT/ML SOLOSTAR PEN: 10.0000 [IU] | PEN_INJECTOR | Freq: Every evening | SUBCUTANEOUS | 5 refills | Status: DC

## 2017-04-27 MED ORDER — BLOOD GLUCOSE MONITOR KIT: PACK | 0 refills | Status: AC

## 2017-04-27 MED ORDER — INSULIN ASPART PROT & ASPART (70-30 MIX) 100 UNIT/ML ~~LOC~~ SUSP: 12.0000 [IU] | Freq: Two times a day (BID) | SUBCUTANEOUS | 11 refills | Status: AC

## 2017-04-27 MED ORDER — INSULIN ASPART PROT & ASPART (70-30 MIX) 100 UNIT/ML ~~LOC~~ SUSP
12.0000 [IU] | Freq: Two times a day (BID) | SUBCUTANEOUS | Status: DC
  Filled 2017-04-27: qty 10

## 2017-04-27 MED ORDER — INSULIN ASPART 100 UNIT/ML FLEXPEN: 3.0000 [IU] | PEN_INJECTOR | Freq: Three times a day (TID) | SUBCUTANEOUS | 5 refills | Status: DC

## 2017-04-27 NOTE — Progress Notes (Signed)
NP on call notified of pt's cbg of 384 at 2353. No current orders for coverage. Will continue to monitor pt & await any new orders. Sanda LingerMilam, Tomekia Helton R, RN

## 2017-04-27 NOTE — Progress Notes (Addendum)
Weekly Diabetes Schedule   Sunday Monday Tuesday Wednesday Thursday Friday Saturday        Check blood sugar before breakfast-7:30 am          Give Novolog 70/30 with Breakfast (must be given with meal)          Morning snack 10 am (optional)          Check blood sugar before lunch-12:30 pm          Afternoon snack-3 pm (optional)          Check blood sugar before supper-6:30 pm          Give Novolog 70/30 with Supper-6:30 pm (must be given with meal)          Check blood sugar at bedtime-10 pm           If blood sugar less than 150 mg/dL eat small snack (16-1015-30 grams carbs.)          -Check blood sugars 4 times a day before each meal and at bedtime -70/30 insulin must be given with breakfast and supper -Call your doctor if blood sugars are greater than 250 mg/dL or less than 80 mg/dL regularly -Do not skip meals -If blood sugar is low (less than 80 mg/dL) or you feel silly, shaky or sweaty- Eat/drink  cup juice or regular soda and recheck blood sugar in 15 minutes  Gave mother above chart to help with organizing care at home.  Insulin will be given 2 times a day only by mother.  Nyoka CowdenKavisha is still struggling with checking her blood sugars so I recommend that family attempt to be with her at lunch to check blood sugar.  At this point, safety is our top priority.  Reviewed with patient and mother signs and symptoms of low blood sugar again.  At first patient responded "I guess I'll drink water".  Reminded her that she needs to drink juice/regular soda to bring blood sugar up.  Again needs lots of reinforcement on diabetes care.  She has appointment tomorrow with primary care doctor and pending appointment with endocrinologist. Gave handout on Hypoglycemia to put on refrigerator.  Father in the room as well.  He was appreciative of information and also asked for hypoglycemia handout for his house.    Thanks,  Beryl MeagerJenny Odeal Welden, RN, BC-ADM Inpatient Diabetes Coordinator Pager [phone] (8a-5p)

## 2017-04-27 NOTE — Progress Notes (Signed)
Patient and family received discharge information and acknowledged understanding of it. RN reviewed diabetes education with patient again and received printed information to take home. Patient received prescriptions.  Patient IV was removed.

## 2017-04-27 NOTE — Progress Notes (Signed)
Inpatient Diabetes Program Recommendations  AACE/ADA: New Consensus Statement on Inpatient Glycemic Control (2015)  Target Ranges:  Prepandial:   less than 140 mg/dL      Peak postprandial:   less than 180 mg/dL (1-2 hours)      Critically ill patients:  140 - 180 mg/dL   Lab Results  Component Value Date   GLUCAP 240 (H) 04/27/2017   HGBA1C 12.7 (H) 04/27/2017    Review of Glycemic ControlResults for [name], Julia (MRN [id number]) as of 04/27/2017 09:52  Ref. Range 04/26/2017 16:15 04/26/2017 21:09 04/26/2017 23:53 04/27/2017 04:11 04/27/2017 07:47  Glucose-Capillary Latest Ref Range: 65 - 99 mg/dL 829336 (H) 562118 (H) 130384 (H) 240 (H) 240 (H)    Diabetes history: New onset DM Outpatient Diabetes medications: None- patient was on steroids Current orders for Inpatient glycemic control:  Lantus 10 units daily, Novolog 3 units tid with meals, Novolog moderate tid with meals and HS  Inpatient Diabetes Program Recommendations:    Spoke with patient and mother regarding DM.  They are very overwhelmed and remember very little from yesterday.  We discussed DM diagnosis and survival skills of DM.  Mother asked me to review insulin pen use with patient to see if she remembers. I gave patient insulin pen and needle.  Patient needed verbal prompts during entire session and finally was able to administer practice injection in insulin dome.  Based on observation, patient is not able to administer insulin independently.  Mother works from 8-5 daily and therefore could administer insulin with breakfast and night time meal.  Called and discussed with provider and insulin regimen to be changed to Novolog 70/30.  Spoke with RN.  Patient needs more practice checking CBG's and administering insulin so it would be best to wait til after lunch for actual d/c so that more education can be done.  Referral also placed for outpatient DM education.  I did ask patient to teach back signs and symptoms of low blood sugar and what  she would do.  After several prompts she was able to say "I'd drink something".  I reinforced that she needs juice, regular soda or glucose tablets for low blood sugar less than 70 mg/dL.  Hopefully patient will atleast be able to check her blood sugar at lunch while her mom is at work.  They state that they could also call her Dad to help out.  Will revisit patient and mother prior to d/c to discuss d/c plan.   Thanks,  Beryl MeagerJenny Reigan Tolliver, RN, BC-ADM Inpatient Diabetes Coordinator Pager [phone] (8a-5p)

## 2017-04-27 NOTE — Discharge Summary (Signed)
Physician Discharge Summary   Patient ID: [name] MRN: [id number] DOB/AGE: [birth year] 22 y.o.  Admit date: 04/25/2017 Discharge date: 04/27/2017  Primary Care Physician:  Patient, No Pcp Per   Recommendations for Outpatient Follow-up:  1. Follow up with PCP in 1-2 weeks 2. Please obtain BMP/CBC in one week 3. Please follow up on the following pending results:  Home Health:none Equipment/Devices: none   Discharge Condition: stable CODE STATUS: FULL  Diet recommendation: Carb modified diet   Discharge Diagnoses:    . Diabetic ketoacidosis without coma associated with type 1 diabetes mellitus ([place]) . Type 1 diabetes mellitus with other specified complication ([place]) . AKI (acute kidney injury) ([place])   Consults: Diabetic coordinator    Allergies:  No Known Allergies   DISCHARGE MEDICATIONS: Allergies as of 04/27/2017   No Known Allergies     Medication List    STOP taking these medications   amoxicillin 500 MG capsule Commonly known as:  AMOXIL   artificial tears Oint ophthalmic ointment   dexamethasone 4 MG tablet Commonly known as:  DECADRON   predniSONE 20 MG tablet Commonly known as:  DELTASONE     TAKE these medications   blood glucose meter kit and supplies Kit Dispense based on patient and insurance preference. Use up to four times daily as directed. (FOR ICD-9 250.00, 250.01).   insulin aspart protamine- aspart (70-30) 100 UNIT/ML injection Commonly known as:  NOVOLOG MIX 70/30 Inject 0.12 mLs (12 Units total) into the skin 2 (two) times daily with a meal.   Pen Needles 3/16" 31G X 5 MM Misc Use as directed        Brief H and P: For complete details please refer to admission H and P, but in brief Julia Floydis a 22 y.o.femalewith no significant past medical historywho presents withseveral days malaise, abdominal pain, vomiting.  Found in ER to have glucose >1000 mg/dL, bicarb 12, Cr doubled from baseline, anion gap 26, pH 7.2.   Started on insulin and fluids for new onset DKA.  Hospital Course:     DKA (diabetic ketoacidoses) ([place]) with new onset likely type 1 diabetes -Anion gap 26, pH 7.2 at the time of admission, creatinine 1.3, baseline 0.6, glucose 1018 -Patient was placed on IV insulin drip and aggressive IV fluid hydration, admitted to stepdown unit -She has been transitioned to subcutaneous insulin.  She received teaching from the diabetic coordinator and nutrition -Patient felt very overwhelmed with her new diagnosis, mother felt more comfortable with NovoLog 70/30 twice daily with meals. - She was given the prescription for glucometer with lancets strips, pen needles, NovoLog 70/30 -She will follow-up with [facility] endocrinology, referral sent    AKI (acute kidney injury) ([place])  -Creatinine 1.3 at the time of admission, baseline 0.6.  Patient received aggressive IV fluid hydration.  Creatinine improved to 0.48 at the time of discharge.   Hyponatremia: Likely pseudohyponatremia due to hyperglycemia -Sodium 123 at the time of admission, improved to 137 at the time of discharge.  Hyperkalemia -Potassium 6.4 likely due to DKA, improved to 3.8 at the time of discharge  Recent wisdom tooth extraction -Completed amoxicillin   Day of Discharge S: no new complaints   BP 107/72   Pulse 95   Temp 98.1 F (36.7 C) (Oral)   Resp 18   Ht _0  (1.499 m)   Wt 41.5 kg (91 lb 6.4 oz)   LMP 04/24/2017   SpO2 100%   BMI 18.46 kg/m   Physical  Exam: General: Alert and awake oriented x3 not in any acute distress. HEENT: anicteric sclera, pupils reactive to light and accommodation CVS: S1-S2 clear no murmur rubs or gallops Chest: clear to auscultation bilaterally, no wheezing rales or rhonchi Abdomen: soft nontender, nondistended, normal bowel sounds Extremities: no cyanosis, clubbing or edema noted bilaterally Neuro: Cranial nerves II-XII intact, no focal neurological deficits   The results of  significant diagnostics from this hospitalization (including imaging, microbiology, ancillary and laboratory) are listed below for reference.      Procedures/Studies:  Ct Abdomen Pelvis W Contrast  Result Date: 04/25/2017 CLINICAL DATA:  Increased fatigue and poor appetite. EXAM: CT ABDOMEN AND PELVIS WITH CONTRAST TECHNIQUE: Multidetector CT imaging of the abdomen and pelvis was performed using the standard protocol following bolus administration of intravenous contrast. CONTRAST:  80 cc ISOVUE-300 IOPAMIDOL (ISOVUE-300) INJECTION 61% COMPARISON:  None. FINDINGS: Lower chest: Normal size heart.  Clear lung bases. Hepatobiliary: No focal liver abnormality is seen. No gallstones, gallbladder wall thickening, or biliary dilatation. Pancreas: Unremarkable. No pancreatic ductal dilatation or surrounding inflammatory changes. Spleen: Normal in size without focal abnormality. Adrenals/Urinary Tract: Adrenal glands are unremarkable. Kidneys are normal, without renal calculi, focal lesion, or hydronephrosis. Bladder is unremarkable. Stomach/Bowel: Physiologic distention of the stomach with normal small bowel rotation. No bowel obstruction or inflammation. A significant amount of retained stool is seen throughout the colon without obstruction or inflammation. Appendix is normal. Vascular/Lymphatic: No significant vascular findings are present. No enlarged abdominal or pelvic lymph nodes. Reproductive: Uterus and bilateral adnexa are unremarkable. Other: No free air nor free fluid. Musculoskeletal: No acute or significant osseous findings. IMPRESSION: Normal appendix. Increased colonic stool burden. No bowel obstruction or inflammation. Electronically Signed   By: Ashley Royalty M.D.   On: 04/25/2017 04:00       LAB RESULTS: Basic Metabolic Panel: Recent Labs  Lab 04/26/17 0409 04/27/17 0352  NA 135 137  K 4.0 3.8  CL 106 102  CO2 19* 23  GLUCOSE 258* 244*  BUN 15 14  CREATININE 0.57 0.48  CALCIUM 8.3*  8.8*   Liver Function Tests: No results for input(s): AST, ALT, ALKPHOS, BILITOT, PROT, ALBUMIN in the last 168 hours. No results for input(s): LIPASE, AMYLASE in the last 168 hours. No results for input(s): AMMONIA in the last 168 hours. CBC: Recent Labs  Lab 04/24/17 2337 04/25/17 1200  WBC 8.8 12.0*  HGB 15.9* 14.4  HCT 45.9 41.6  MCV 66.7* 65.1*  PLT 432* 349   Cardiac Enzymes: No results for input(s): CKTOTAL, CKMB, CKMBINDEX, TROPONINI in the last 168 hours. BNP: Invalid input(s): POCBNP CBG: Recent Labs  Lab 04/27/17 0411 04/27/17 0747  GLUCAP 240* 240*      Disposition and Follow-up: Discharge Instructions    Ambulatory referral to Nutrition and Diabetic Education   Complete by:  As directed    Diet Carb Modified   Complete by:  As directed    Discharge instructions   Complete by:  As directed    You were admitted for new Type 1 diabetes.   Check your blood sugar every day when you wake up, and before each meal. Also, check your blood sugar if you have symptoms of low blood sugar (dizziness, sweaty, sick, tired, jittery, weak).       If your blood sugar is low (like less than 70), drink some orange juice, eat some crackers and check your blood sugar again in 15 minutes.  If it stays low, seek medical attention.  Also, check your blood sugar if you have symptoms of HIGH blood sugar (blurry vision, peeing all the time, feeling thirsty all the time, vomiting or abdominal pain).  If your blood sugar is higher than 400, call your doctor or come to the ER.   Discharge instructions   Complete by:  As directed    It is VERY IMPORTANT that you follow up with a PCP on a regular basis.  Check your blood glucoses before each meal and at bedtime and maintain a log of your readings.  Bring this log with you when you follow up with your PCP so that he or she can adjust your insulin at your follow up visit.   Increase activity slowly   Complete by:  As directed         DISPOSITION: home    [place], [facility] Follow up on 04/28/2017.   Why:  @ 3:30 pm with Juluis Mire NP for hospital follow up-Please make office aware if you can not make this appointment to reschedule.  Contact information: [place] [address] [phone]        [place] Follow up.   Why:  Information sent to office for new referral -Office to call patient once she can get approved to get scheduled for appointment.  Please ask about Libre freestyle or Dexcom for blood sugar checking.  Contact information: [address], [place] [address] [phone])            Time coordinating discharge:  30mns  Signed:   REstill CottaM.D. [facility] 04/27/2017, 10:32 AM Pager: [phone]

## 2017-04-29 ENCOUNTER — Telehealth: Payer: Self-pay | Admitting: *Deleted

## 2019-10-21 IMAGING — CT CT ABD-PELV W/ CM
2 of 4 series · 16 of 46 positions shown, 18 images · IV contrast (Omni 300)
Comparison: None.

CLINICAL DATA: Increased fatigue and poor appetite.

EXAM:
CT ABDOMEN AND PELVIS WITH CONTRAST
TECHNIQUE: Multidetector CT imaging of the abdomen and pelvis was performed
using the standard protocol following bolus administration of
intravenous contrast.
CONTRAST:  80 cc 5QPJZ6-455 IOPAMIDOL (5QPJZ6-455) INJECTION 61%

[Series 4: a/p w/ 5mm · axial · 0.55mm/px · z∈[+1071,+1421]mm · 13 of 79 slices shown, 15 images]
[im 6/79  soft-tissue]
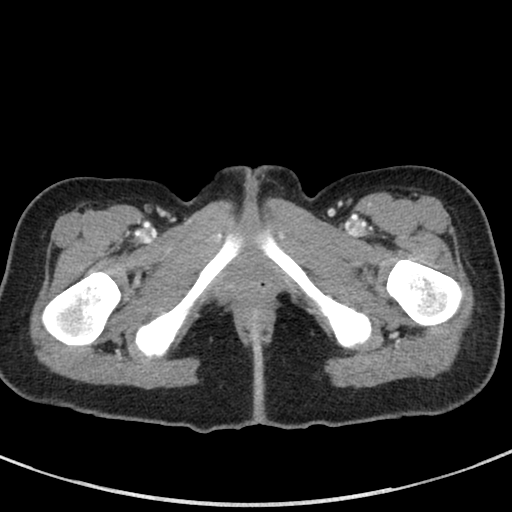
[im 6/79  bone]
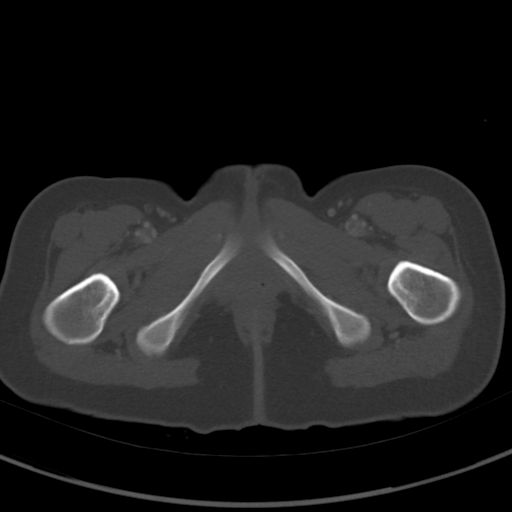
[im 12/79  soft-tissue]
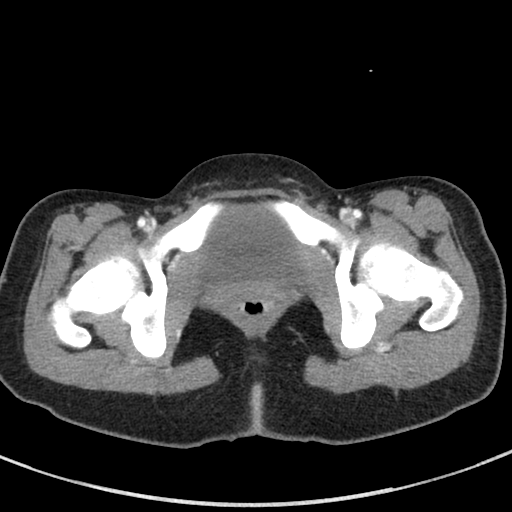
[im 18/79  soft-tissue]
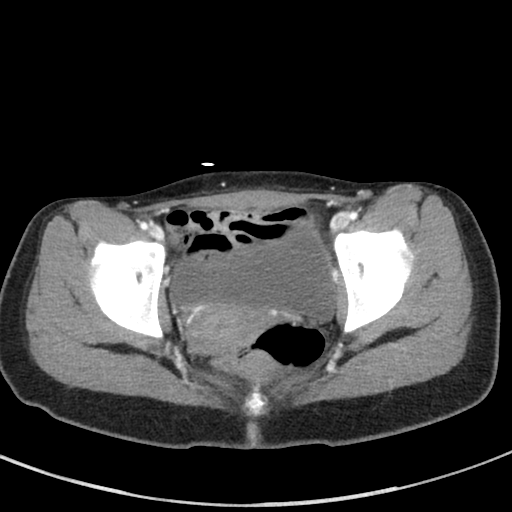
[im 24/79  soft-tissue]
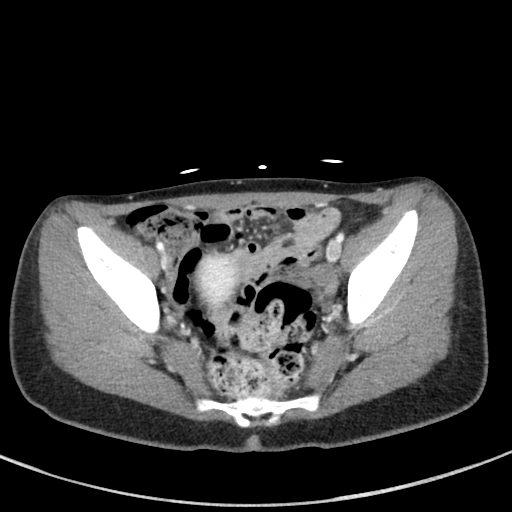
[im 29/79  soft-tissue]
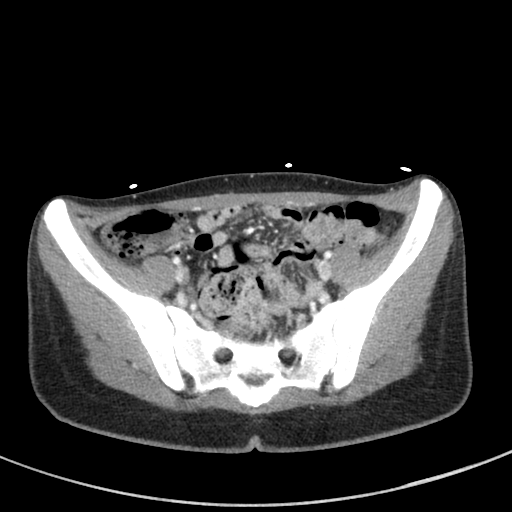
[im 35/79  soft-tissue]
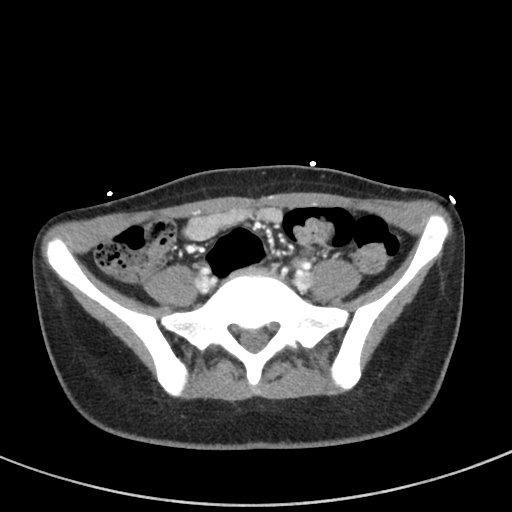
[im 41/79  soft-tissue]
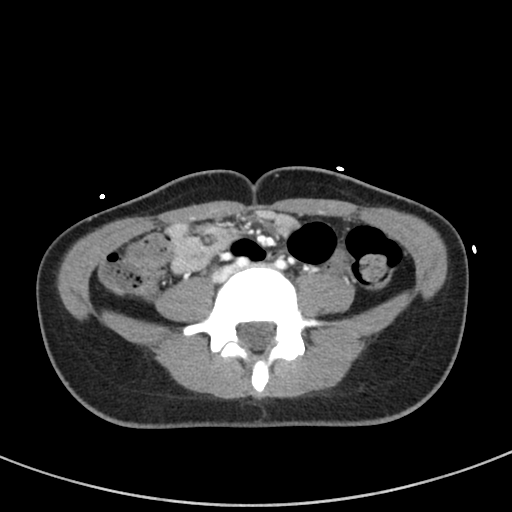
[im 47/79  soft-tissue]
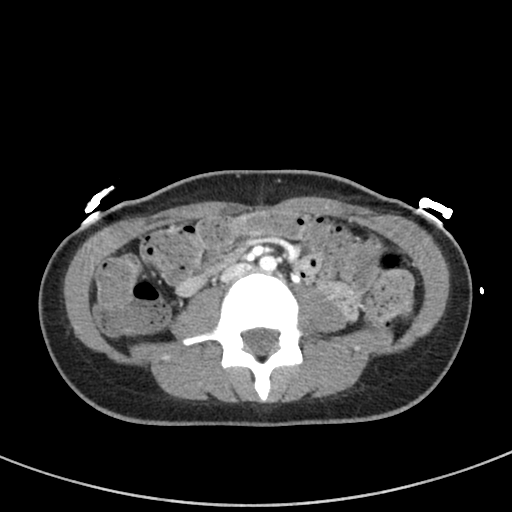
[im 53/79  soft-tissue]
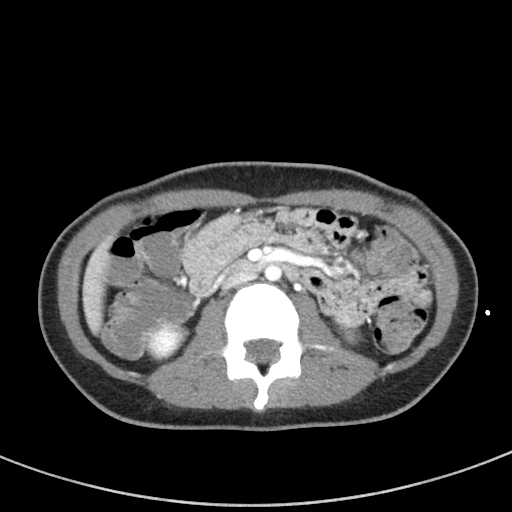
[im 53/79  bone]
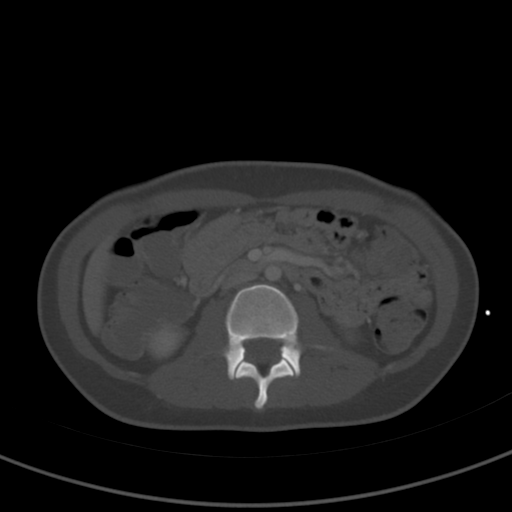
[im 58/79  soft-tissue]
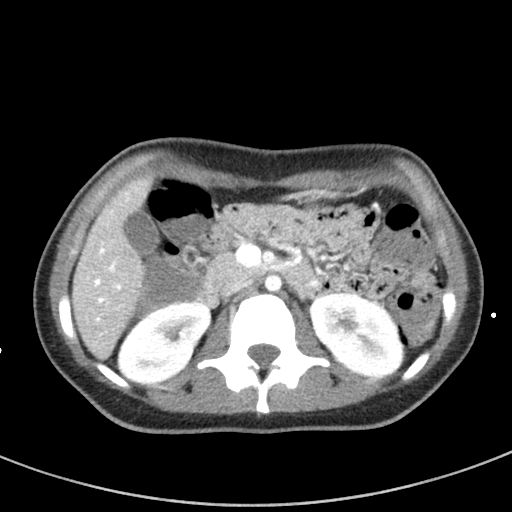
[im 64/79  soft-tissue]
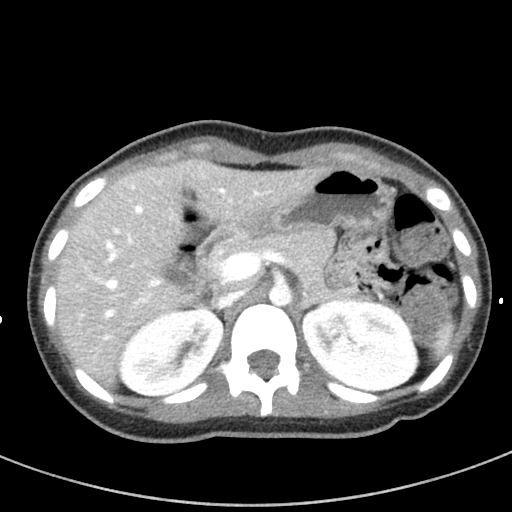
[im 70/79  soft-tissue]
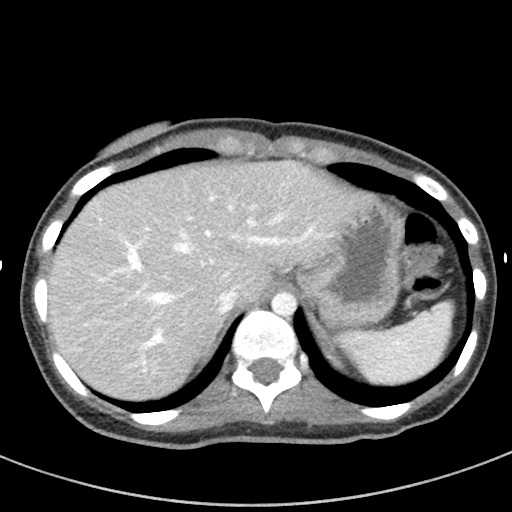
[im 76/79  soft-tissue]
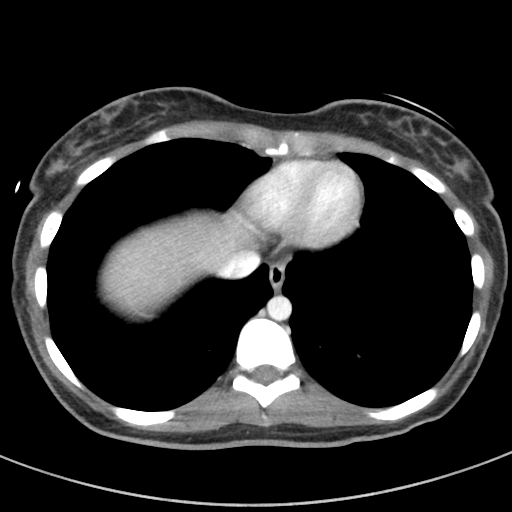

[Series 7: a/p w/ cor · coronal · 0.62mm/px · 3 of 86 slices shown]
[im 29/86  soft-tissue]
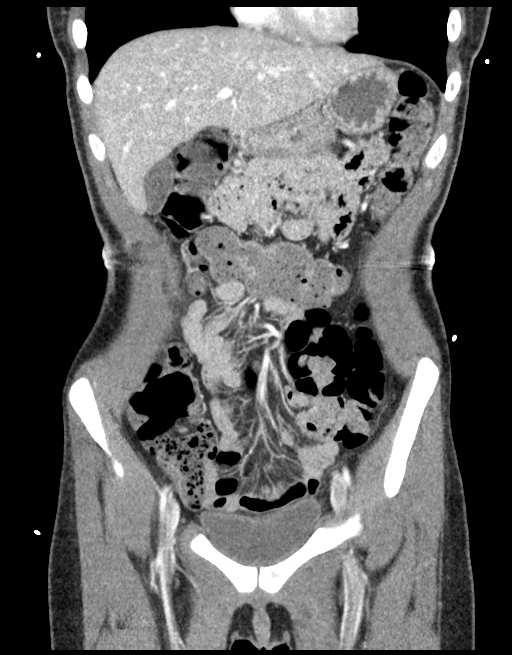
[im 38/86  soft-tissue]
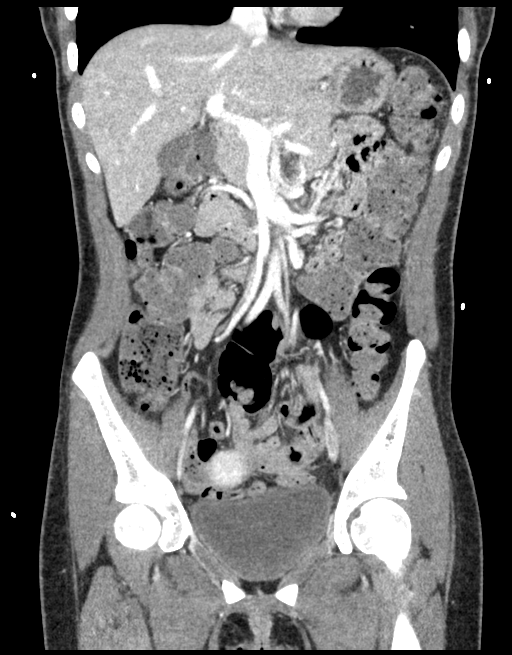
[im 48/86  soft-tissue]
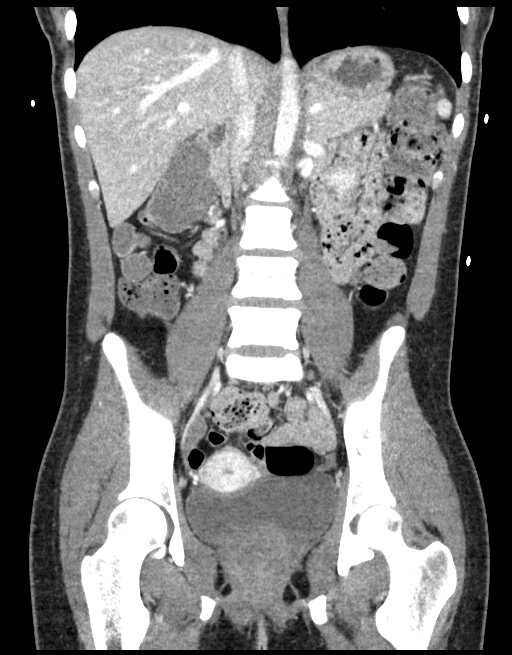

[16 of 46 positions shown; findings below may reference images not displayed]

FINDINGS: Lower chest: Normal size heart.  Clear lung bases.

Hepatobiliary: No focal liver abnormality is seen. No gallstones,
gallbladder wall thickening, or biliary dilatation.

Pancreas: Unremarkable. No pancreatic ductal dilatation or
surrounding inflammatory changes.

Spleen: Normal in size without focal abnormality.

Adrenals/Urinary Tract: Adrenal glands are unremarkable. Kidneys are
normal, without renal calculi, focal lesion, or hydronephrosis.
Bladder is unremarkable.

Stomach/Bowel: Physiologic distention of the stomach with normal
small bowel rotation. No bowel obstruction or inflammation. A
significant amount of retained stool is seen throughout the colon
without obstruction or inflammation. Appendix is normal.

Vascular/Lymphatic: No significant vascular findings are present. No
enlarged abdominal or pelvic lymph nodes.

Reproductive: Uterus and bilateral adnexa are unremarkable.

Other: No free air nor free fluid.

Musculoskeletal: No acute or significant osseous findings.
IMPRESSION: Normal appendix. Increased colonic stool burden. No bowel
obstruction or inflammation.

## 2020-03-08 ENCOUNTER — Telehealth (INDEPENDENT_AMBULATORY_CARE_PROVIDER_SITE_OTHER): Payer: Self-pay

## 2020-03-08 NOTE — Telephone Encounter (Signed)
Copied from CRM [id number]. Topic: General - Inquiry >> Mar 08, 2020  9:06 AM Daphine Deutscher D wrote: Reason for CRM: Mom called to say her daughter will need a refill on her low estrogen.  Mom states she is on her last pill.  Wilmer Floor  CB#  [phone]  Patient has an appt scheduled for Feb 10.

## 2020-03-11 NOTE — Telephone Encounter (Signed)
This is not a patient at [facility].

## 2020-03-14 ENCOUNTER — Other Ambulatory Visit: Payer: Self-pay

## 2020-03-14 ENCOUNTER — Encounter (INDEPENDENT_AMBULATORY_CARE_PROVIDER_SITE_OTHER): Payer: Self-pay | Admitting: Primary Care

## 2020-03-14 ENCOUNTER — Ambulatory Visit (INDEPENDENT_AMBULATORY_CARE_PROVIDER_SITE_OTHER): Payer: Medicaid Other | Admitting: Primary Care

## 2020-03-14 VITALS — BP 114/80 | HR 90 | Temp 97.3°F | Ht 59.0 in | Wt 125.8 lb

## 2020-03-14 DIAGNOSIS — Z7689 Persons encountering health services in other specified circumstances: Secondary | ICD-10-CM | POA: Diagnosis not present

## 2020-03-14 DIAGNOSIS — Z30019 Encounter for initial prescription of contraceptives, unspecified: Secondary | ICD-10-CM | POA: Diagnosis not present

## 2020-03-14 DIAGNOSIS — E1069 Type 1 diabetes mellitus with other specified complication: Secondary | ICD-10-CM | POA: Diagnosis not present

## 2020-03-14 LAB — POCT URINE PREGNANCY: Preg Test, Ur: NEGATIVE

## 2020-03-14 MED ORDER — LO LOESTRIN FE 1 MG-10 MCG / 10 MCG PO TABS: 1.0000 | ORAL_TABLET | Freq: Every day | ORAL | 11 refills | Status: DC

## 2020-03-14 NOTE — Progress Notes (Signed)
New Patient Office Visit  Subjective:  Patient ID: [name], female    DOB: [birth year]  Age: 25 y.o. MRN: [id number]  CC:  Chief Complaint  Patient presents with  . New Patient (Initial Visit)    Diabetes      HPI Ms. [name] is a 25 year old female who presents for of care ,she mention diabetes however she has Type 1 diabetes followed by endocrinology at [facility]. She is requesting a refill on birth control and has been out for 2 week and just came off her cycle Friday. She voices no other complains or problems  Past Medical History:  Diagnosis Date  . Seizures ([place])   . Seizures ([place])     No past surgical history on file.  Family History  Problem Relation Age of Onset  . Hypertension Mother   . Autoimmune disease Neg Hx     Social History   Socioeconomic History  . Marital status: Single    Spouse name: Not on file  . Number of children: Not on file  . Years of education: Not on file  . Highest education level: Not on file  Occupational History  . Not on file  Tobacco Use  . Smoking status: Never Smoker  . Smokeless tobacco: Never Used  Substance and Sexual Activity  . Alcohol use: No  . Drug use: No  . Sexual activity: Not on file  Other Topics Concern  . Not on file  Social History Narrative  . Not on file   Social Determinants of Health   Financial Resource Strain: Not on file  Food Insecurity: Not on file  Transportation Needs: Not on file  Physical Activity: Not on file  Stress: Not on file  Social Connections: Not on file  Intimate Partner Violence: Not on file    ROS Review of Systems  All other systems reviewed and are negative.   Objective:   Today's Vitals: BP 114/80 (BP Location: Right Arm, Patient Position: Sitting, Cuff Size: Normal)   Pulse 90   Temp (!) 97.3 F (36.3 C) (Temporal)   Ht '4\' 11"'  (1.499 m)   Wt 125 lb 12.8 oz (57.1 kg)   LMP 03/08/2020 (Exact Date)   SpO2 97%   BMI 25.41 kg/m   Physical  Exam Vitals reviewed.  Constitutional:      Appearance: Normal appearance.  HENT:     Right Ear: Tympanic membrane and external ear normal.     Left Ear: Tympanic membrane and external ear normal.     Nose: Nose normal.  Eyes:     Extraocular Movements: Extraocular movements intact.     Pupils: Pupils are equal, round, and reactive to light.  Cardiovascular:     Rate and Rhythm: Normal rate and regular rhythm.  Pulmonary:     Effort: Pulmonary effort is normal.  Abdominal:     General: Abdomen is flat. Bowel sounds are normal.     Palpations: Abdomen is soft.  Musculoskeletal:        General: Normal range of motion.     Cervical back: Normal range of motion.  Skin:    General: Skin is warm and dry.  Neurological:     Mental Status: She is alert and oriented to person, place, and time.  Psychiatric:        Mood and Affect: Mood normal.        Behavior: Behavior normal.        Thought Content: Thought content  normal.        Judgment: Judgment normal.     Assessment & Plan:  Antwonette was seen today for new patient (initial visit).  Diagnoses and all orders for this visit:  Encounter to establish care establish care with PCP  Encounter for female birth control -     POCT urine pregnancy negative  -     Norethindrone-Ethinyl Estradiol-Fe Biphas (LO LOESTRIN FE) 1 MG-10 MCG / 10 MCG tablet; Take 1 tablet by mouth daily.  Type 1 diabetes mellitus with other specified complication ([place]) Reviewed  A1C 5.9 followed by endocrinology     Outpatient Encounter Medications as of 03/14/2020  Medication Sig  . blood glucose meter kit and supplies KIT Dispense based on patient and insurance preference. Use up to four times daily as directed. (FOR ICD-9 250.00, 250.01).  . insulin aspart protamine- aspart (NOVOLOG MIX 70/30) (70-30) 100 UNIT/ML injection Inject 0.12 mLs (12 Units total) into the skin 2 (two) times daily with a meal.  . Insulin Pen Needle (PEN NEEDLES 3/16") 31G X 5  MM MISC Use as directed  . Norethindrone-Ethinyl Estradiol-Fe Biphas (LO LOESTRIN FE) 1 MG-10 MCG / 10 MCG tablet Take 1 tablet by mouth daily.   No facility-administered encounter medications on file as of 03/14/2020.    Follow-up: Return if symptoms worsen or fail to improve.   Kerin Perna, NP

## 2021-01-15 ENCOUNTER — Other Ambulatory Visit (INDEPENDENT_AMBULATORY_CARE_PROVIDER_SITE_OTHER): Payer: Self-pay | Admitting: Primary Care

## 2021-01-15 DIAGNOSIS — Z30019 Encounter for initial prescription of contraceptives, unspecified: Secondary | ICD-10-CM

## 2021-01-15 MED ORDER — LO LOESTRIN FE 1 MG-10 MCG / 10 MCG PO TABS: 1.0000 | ORAL_TABLET | Freq: Every day | ORAL | 11 refills | Status: DC

## 2021-01-17 LAB — HEMOGLOBIN A1C: Hemoglobin A1C: 7.9

## 2021-01-17 LAB — HM DIABETES FOOT EXAM

## 2021-03-10 ENCOUNTER — Other Ambulatory Visit (INDEPENDENT_AMBULATORY_CARE_PROVIDER_SITE_OTHER): Payer: Self-pay | Admitting: Primary Care

## 2021-03-10 DIAGNOSIS — Z30019 Encounter for initial prescription of contraceptives, unspecified: Secondary | ICD-10-CM

## 2021-03-17 ENCOUNTER — Ambulatory Visit (INDEPENDENT_AMBULATORY_CARE_PROVIDER_SITE_OTHER): Payer: Medicaid Other | Admitting: Primary Care

## 2021-03-19 ENCOUNTER — Other Ambulatory Visit: Payer: Self-pay

## 2021-03-19 ENCOUNTER — Other Ambulatory Visit (HOSPITAL_COMMUNITY)
Admission: RE | Admit: 2021-03-19 | Discharge: 2021-03-19 | Disposition: A | Discharge status: Home / Self Care | Payer: Medicaid Other | Source: Ambulatory Visit | Attending: Primary Care | Admitting: Primary Care

## 2021-03-19 ENCOUNTER — Encounter (INDEPENDENT_AMBULATORY_CARE_PROVIDER_SITE_OTHER): Payer: Self-pay | Admitting: Primary Care

## 2021-03-19 ENCOUNTER — Telehealth: Payer: Self-pay

## 2021-03-19 ENCOUNTER — Ambulatory Visit (INDEPENDENT_AMBULATORY_CARE_PROVIDER_SITE_OTHER): Payer: Medicaid Other | Admitting: Primary Care

## 2021-03-19 VITALS — BP 124/86 | HR 92 | Temp 97.5°F | Resp 16 | Wt 127.0 lb

## 2021-03-19 DIAGNOSIS — E1069 Type 1 diabetes mellitus with other specified complication: Secondary | ICD-10-CM

## 2021-03-19 DIAGNOSIS — Z124 Encounter for screening for malignant neoplasm of cervix: Secondary | ICD-10-CM | POA: Diagnosis present

## 2021-03-19 DIAGNOSIS — F322 Major depressive disorder, single episode, severe without psychotic features: Secondary | ICD-10-CM | POA: Diagnosis not present

## 2021-03-19 DIAGNOSIS — Z113 Encounter for screening for infections with a predominantly sexual mode of transmission: Secondary | ICD-10-CM

## 2021-03-19 NOTE — Progress Notes (Signed)
PAP

## 2021-03-19 NOTE — Telephone Encounter (Signed)
Spoke to patient today while she was with Juluis Mire, NP at [facility] for her scheduled appointment. She spoke about frustration with her job and employer. She stated she has applied for 99 jobs and was not offered any of them.   Sharyn Lull spoke to her about feeling empowered and inquired how we may be able to help her.  She described how she enjoyed drawing and then said that reading is difficult for her.  She was agreeable to having me call [place] about classes that could help her with reading in efforts to help her confidence and feel more empowered.  She said # [phone] is best number to reach her.

## 2021-03-19 NOTE — Progress Notes (Signed)
°  [facility]  WELL-WOMAN PHYSICAL & PAP Patient name: [name] MRN [id number]  Date of birth: 1995/08/25 Chief Complaint:   Gynecologic Exam  History of Present Illness:   [name] is a 26 y.o. No obstetric history on file. female being seen today for a routine well-woman exam.   CC:gyn   The current method of family planning is abstinence.  Patient's last menstrual period was 02/27/2021 (approximate). Last pap -never Review of Systems:    Denies any headaches, blurred vision, fatigue, shortness of breath, chest pain, abdominal pain, abnormal vaginal discharge/itching/odor/irritation, problems with periods, bowel movements, urination, or intercourse unless otherwise stated above.  Pertinent History Reviewed:   Reviewed past medical,surgical, social and family history.  Reviewed problem list, medications and allergies.  Physical Assessment:   Vitals:   03/19/21 0908 03/19/21 0913  BP: 137/90 124/86  Pulse: 92   Resp: 16   Temp: (!) 97.5 F (36.4 C)   Weight: 127 lb (57.6 kg)   Body mass index is 25.65 kg/m.        Physical Examination:  General appearance - well appearing, and in no distress Mental status - alert, oriented to person, place, and time Psych:  She has a normal mood and affect Skin - warm and dry, self inflicted scares arms , back and breast  Chest - effort normal, all lung fields clear to auscultation bilaterally Heart - normal rate and regular rhythm Neck:  midline trachea, no thyromegaly or nodules Breasts - breasts appear normal, no suspicious masses, no skin or nipple changes or axillary nodes Educated patient on proper self breast examination and had patient to demonstrate SBE. Abdomen - soft, nontender, nondistended, no masses or organomegaly Pelvic-VULVA: normal appearing vulva with no masses, tenderness or lesions   VAGINA: normal appearing vagina with normal color and discharge, no lesions   CERVIX: normal appearing  cervix without discharge or lesions, no CMT UTERUS: uterus is felt to be normal size, shape, consistency and nontender  ADNEXA: No adnexal masses or tenderness noted. Extremities:  No swelling or varicosities noted  No results found for this or any previous visit (from the past 24 hour(s)).   Assessment & Plan:  [name] was seen today for gynecologic exam.  Diagnoses and all orders for this visit:  Cervical cancer screening -     Cytology - PAP([place])  Screening examination for STD (sexually transmitted disease) -     Cervicovaginal ancillary only  Type 1 diabetes mellitus with other specified complication (H[facility]) Followed by endocrinology  Current severe episode of major depressive disorder without psychotic features without prior episode (HCC) Refuses medication- father and is not having anything to do with her causing pain and more rejection, brother has brain cancer and dying , tired of loosing family , applied for 99 jobs on [employer] no response except 5 and no offers. 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    Meds: No orders of the defined types were placed in this encounter.   Follow-up: No follow-ups on file.  This note has been created with Education officer, environmental. Any transcriptional errors are unintentional.   Grayce Sessions, NP 03/19/2021, 1:40 PM

## 2021-03-20 ENCOUNTER — Telehealth: Payer: Self-pay

## 2021-03-20 ENCOUNTER — Other Ambulatory Visit (INDEPENDENT_AMBULATORY_CARE_PROVIDER_SITE_OTHER): Payer: Self-pay | Admitting: Primary Care

## 2021-03-20 DIAGNOSIS — N760 Acute vaginitis: Secondary | ICD-10-CM

## 2021-03-20 DIAGNOSIS — B9689 Other specified bacterial agents as the cause of diseases classified elsewhere: Secondary | ICD-10-CM

## 2021-03-20 LAB — CERVICOVAGINAL ANCILLARY ONLY
Bacterial Vaginitis (gardnerella): POSITIVE — AB
Candida Glabrata: NEGATIVE
Candida Vaginitis: NEGATIVE
Chlamydia: NEGATIVE
Comment: NEGATIVE
Comment: NEGATIVE
Comment: NEGATIVE
Comment: NEGATIVE
Comment: NEGATIVE
Comment: NORMAL
Neisseria Gonorrhea: NEGATIVE
Trichomonas: NEGATIVE

## 2021-03-20 LAB — CYTOLOGY - PAP
Adequacy: ABSENT
Comment: NEGATIVE
Diagnosis: NEGATIVE
High risk HPV: NEGATIVE

## 2021-03-20 MED ORDER — METRONIDAZOLE 500 MG PO TABS: 500.0000 mg | ORAL_TABLET | Freq: Two times a day (BID) | ORAL | 0 refills | Status: DC

## 2021-03-20 NOTE — Telephone Encounter (Signed)
Call placed to [facility] Adult Ed # [phone] x [phone] to inquire about classes that may benefit patient with her desire to improve her reading.  Message left with call back requested to this CM.  Attempted to contact patient # [phone] and the phone just rings, no option to leave a message.

## 2021-03-21 NOTE — Telephone Encounter (Signed)
Attempted again to contact patient # [phone] to inform her that this CM is waiting for information from [facility] about programs that may benefit her and the phone just rings, no option to leave a message.

## 2021-03-24 NOTE — Telephone Encounter (Signed)
Message received from [place]/ [facility] Adult Ed requesting a call back # [phone] x [phone]

## 2021-03-25 ENCOUNTER — Telehealth (INDEPENDENT_AMBULATORY_CARE_PROVIDER_SITE_OTHER): Payer: Self-pay

## 2021-03-25 NOTE — Telephone Encounter (Signed)
Unable to reach patient on home phone. Per DPR left voicemail informing patient of normal pap and STD screening. Positive for BV, medication has been sent to pharmacy. Advised on how to take and to avoid alcohol while taking. Return call to [facility] at [phone] with any questions or concerns. Maryjean Morn, CMA

## 2021-03-25 NOTE — Telephone Encounter (Signed)
-----   Message from Grayce Sessions, NP sent at 03/20/2021  3:30 PM EST ----- Pap and STD screening all normal

## 2021-03-27 NOTE — Telephone Encounter (Signed)
Call placed to [place]/Adult Education at [facility], she explained that they offer a transitions program that addresses reading, math and digital skills to prepare a person for college or a career. The courses are free and are offered in person. Regarding transportation, she said that after the first week of classes, they provide free bus passes for [place] city bus based on the student's attendance. The student would be responsible for the first week of transportation. Ladona Ridgel  stated that the patient can call her back and provide her name and contact information and she will have the academic counselor reach out to her.  The patient can call # [phone] x [phone].  Attempted to contact patient #  [phone] and the phone just rings, unable to leave a message.

## 2021-05-01 ENCOUNTER — Telehealth: Payer: Self-pay

## 2021-05-01 NOTE — Telephone Encounter (Signed)
[name]- I never heard back from the patient to share this information.  ?

## 2021-05-01 NOTE — Telephone Encounter (Signed)
Roetta Sessions - I never heard back from the patient to share the information about [employer] program to address her reading skills.  ?

## 2021-08-29 ENCOUNTER — Other Ambulatory Visit (INDEPENDENT_AMBULATORY_CARE_PROVIDER_SITE_OTHER): Payer: Self-pay | Admitting: Primary Care

## 2021-08-29 DIAGNOSIS — Z30019 Encounter for initial prescription of contraceptives, unspecified: Secondary | ICD-10-CM

## 2021-09-01 MED ORDER — LO LOESTRIN FE 1 MG-10 MCG / 10 MCG PO TABS: 1.0000 | ORAL_TABLET | Freq: Every day | ORAL | 0 refills | Status: DC

## 2021-09-01 NOTE — Telephone Encounter (Signed)
Patient has scheduled appointment- can she get RF until appointment?

## 2021-09-01 NOTE — Telephone Encounter (Signed)
Pt's mother called reporting that the patient will be out of her current supply tomorrow. She scheduled her next available appt

## 2021-09-01 NOTE — Telephone Encounter (Signed)
Requested Prescriptions  Pending Prescriptions Disp Refills  . Norethindrone-Ethinyl Estradiol-Fe Biphas (LO LOESTRIN FE) 1 MG-10 MCG / 10 MCG tablet 28 tablet 0    Sig: Take 1 tablet by mouth daily.     OB/GYN:  Contraceptives Passed - 09/01/2021  2:35 PM      Passed - Last BP in normal range    BP Readings from Last 1 Encounters:  03/19/21 124/86         Passed - Valid encounter within last 12 months    Recent Outpatient Visits          5 months ago Cervical cancer screening   [facility] Grayce Sessions, NP   1 year ago Encounter to establish care   [facility] Grayce Sessions, NP      Future Appointments            In 3 weeks Randa Evens, Kinnie Scales, NP [facility]           Passed - Patient is not a smoker      Refused Prescriptions Disp Refills  . LO LOESTRIN FE 1 MG-10 MCG / 10 MCG tablet [Pharmacy Med Name: Lo Loestrin Fe 1 MG-10 MCG / 10 MCG Oral Tablet] 28 tablet 0    Sig: Take 1 tablet by mouth once daily     OB/GYN:  Contraceptives Passed - 09/01/2021  2:35 PM      Passed - Last BP in normal range    BP Readings from Last 1 Encounters:  03/19/21 124/86         Passed - Valid encounter within last 12 months    Recent Outpatient Visits          5 months ago Cervical cancer screening   [facility] Grayce Sessions, NP   1 year ago Encounter to establish care   [facility] Grayce Sessions, NP      Future Appointments            In 3 weeks Randa Evens Kinnie Scales, NP [facility]           Passed - Patient is not a smoker

## 2021-09-01 NOTE — Addendum Note (Signed)
Addended by: Elby Beck F on: 09/01/2021 02:35 PM   Modules accepted: Orders

## 2021-09-23 ENCOUNTER — Encounter (INDEPENDENT_AMBULATORY_CARE_PROVIDER_SITE_OTHER): Payer: Self-pay | Admitting: Primary Care

## 2021-09-23 ENCOUNTER — Ambulatory Visit (INDEPENDENT_AMBULATORY_CARE_PROVIDER_SITE_OTHER): Payer: Medicaid Other | Admitting: Primary Care

## 2021-09-23 VITALS — BP 126/87 | HR 86 | Temp 97.9°F | Ht 59.0 in | Wt 130.4 lb

## 2021-09-23 DIAGNOSIS — Z3202 Encounter for pregnancy test, result negative: Secondary | ICD-10-CM | POA: Diagnosis not present

## 2021-09-23 DIAGNOSIS — Z23 Encounter for immunization: Secondary | ICD-10-CM | POA: Diagnosis not present

## 2021-09-23 DIAGNOSIS — L309 Dermatitis, unspecified: Secondary | ICD-10-CM | POA: Diagnosis not present

## 2021-09-23 DIAGNOSIS — Z30019 Encounter for initial prescription of contraceptives, unspecified: Secondary | ICD-10-CM

## 2021-09-23 LAB — POCT URINE PREGNANCY: Preg Test, Ur: NEGATIVE

## 2021-09-23 MED ORDER — LO LOESTRIN FE 1 MG-10 MCG / 10 MCG PO TABS: 1.0000 | ORAL_TABLET | Freq: Every day | ORAL | 6 refills | Status: DC

## 2021-09-23 MED ORDER — TRIAMCINOLONE 0.1 % CREAM:EUCERIN CREAM 1:1: 1.0000 | TOPICAL_CREAM | Freq: Two times a day (BID) | CUTANEOUS | 3 refills | Status: DC | PRN

## 2021-09-23 NOTE — Progress Notes (Signed)
    [facility]  GYN CONTRACEPTION VISIT Patient name: [name] MRN [id number]  Date of birth: 06-14-1995 Chief Complaint:   Medication Refill (Birth control pills)  History of Present Illness:   [name] is a 26 y.o. No obstetric history on file. African American female being seen today for contraception.     Patient's last menstrual period was 07/16/2021 (approximate). The current method of family planning is condoms.  Last pap . Results were:  normal 03/19/21 Pertinent items are noted in HPI Denies fever/chills, dizziness, headaches, visual disturbances, fatigue, shortness of breath, chest pain, abdominal pain, vomiting, abnormal vaginal discharge/itching/odor/irritation, problems with periods, bowel movements, urination, or intercourse unless otherwise stated above.  Pertinent History Reviewed:  Reviewed past medical,surgical, social, obstetrical and family history.  Reviewed problem list, medications and allergies. Physical Assessment:   Vitals:   09/23/21 1115  BP: 126/87  Pulse: 86  Temp: 97.9 F (36.6 C)  TempSrc: Oral  SpO2: 94%  Weight: 130 lb 6.4 oz (59.1 kg)  Height: 4\' 11"  (1.499 m)  Body mass index is 26.34 kg/m.       Physical Examination:   General appearance: alert, well appearing, and in no distress  Mental status: alert, oriented to person, place, and time  Skin: warm & dry   Cardiovascular: normal heart rate noted  Respiratory: normal respiratory effort, no distress  Abdomen: soft, non-tender   Pelvic: normal external genitalia, vulva, vagina, cervix, uterus and adnexa, VULVA: normal appearing vulva with no masses, tenderness or lesions, VAGINA: normal appearing vagina with normal color and discharge, no lesions, CERVIX: normal appearing cervix without discharge or lesions, UTERUS: uterus is normal size, shape, consistency and nontender, ADNEXA: normal adnexa in size, nontender and no masses  Extremities: no edema   No results  found for this or any previous visit (from the past 24 hour(s)).  [name] was seen today for medication refill.  Diagnoses and all orders for this visit:  Need for Tdap vaccination -     Tdap vaccine greater than or equal to 7yo IM  Encounter for female birth control -     POCT urine pregnancy -     Norethindrone-Ethinyl Estradiol-Fe Biphas (LO LOESTRIN FE) 1 MG-10 MCG / 10 MCG tablet; Take 1 tablet by mouth daily.  Eczema, unspecified type -     Triamcinolone Acetonide (TRIAMCINOLONE 0.1 % CREAM : EUCERIN) CREA; Apply 1 Application topically 2 (two) times daily as needed for rash, itching or irritation.  Need for immunization against influenza -     Flu Vaccine QUAD 59mo+IM (Fluarix, Fluzone & Alfiuria Quad PF)  Other orders -     Cancel: HgB A1c -     Cancel: Microalbumin/Creatinine Ratio, Urine      Orders Placed This Encounter  Procedures   Tdap vaccine greater than or equal to 7yo IM   POCT urine pregnancy    No follow-ups on file.  This note has been created with 5mo. Any transcriptional errors are unintentional.   Education officer, environmental, NP 09/23/2021, 11:23 AM

## 2021-09-23 NOTE — Patient Instructions (Addendum)
Influenza, Adult Influenza is also called "the flu." It is an infection in the lungs, nose, and throat (respiratory tract). It spreads easily from person to person (is contagious). The flu causes symptoms that are like a cold, along with high fever and body aches. What are the causes? This condition is caused by the influenza virus. You can get the virus by: Breathing in droplets that are in the air after a person infected with the flu coughed or sneezed. Touching something that has the virus on it and then touching your mouth, nose, or eyes. What increases the risk? Certain things may make you more likely to get the flu. These include: Not washing your hands often. Having close contact with many people during cold and flu season. Touching your mouth, eyes, or nose without first washing your hands. Not getting a flu shot every year. You may have a higher risk for the flu, and serious problems, such as a lung infection (pneumonia), if you: Are older than 65. Are pregnant. Have a weakened disease-fighting system (immune system) because of a disease or because you are taking certain medicines. Have a long-term (chronic) condition, such as: Heart, kidney, or lung disease. Diabetes. Asthma. Have a liver disorder. Are very overweight (morbidly obese). Have anemia. What are the signs or symptoms? Symptoms usually begin suddenly and last 4-14 days. They may include: Fever and chills. Headaches, body aches, or muscle aches. Sore throat. Cough. Runny or stuffy (congested) nose. Feeling discomfort in your chest. Not wanting to eat as much as normal. Feeling weak or tired. Feeling dizzy. Feeling sick to your stomach or throwing up. How is this treated? If the flu is found early, you can be treated with antiviral medicine. This can help to reduce how bad the illness is and how long it lasts. This may be given by mouth or through an IV tube. Taking care of yourself at home can help your  symptoms get better. Your doctor may want you to: Take over-the-counter medicines. Drink plenty of fluids. The flu often goes away on its own. If you have very bad symptoms or other problems, you may be treated in a hospital. Follow these instructions at home:     Activity Rest as needed. Get plenty of sleep. Stay home from work or school as told by your doctor. Do not leave home until you do not have a fever for 24 hours without taking medicine. Leave home only to go to your doctor. Eating and drinking Take an ORS (oral rehydration solution). This is a drink that is sold at pharmacies and stores. Drink enough fluid to keep your pee pale yellow. Drink clear fluids in small amounts as you are able. Clear fluids include: Water. Ice chips. Fruit juice mixed with water. Low-calorie sports drinks. Eat bland foods that are easy to digest. Eat small amounts as you are able. These foods include: Bananas. Applesauce. Rice. Lean meats. Toast. Crackers. Do not eat or drink: Fluids that have a lot of sugar or caffeine. Alcohol. Spicy or fatty foods. General instructions Take over-the-counter and prescription medicines only as told by your doctor. Use a cool mist humidifier to add moisture to the air in your home. This can make it easier for you to breathe. When using a cool mist humidifier, clean it daily. Empty water and replace with clean water. Cover your mouth and nose when you cough or sneeze. Wash your hands with soap and water often and for at least 20 seconds. This is also   important after you cough or sneeze. If you cannot use soap and water, use alcohol-based hand sanitizer. Keep all follow-up visits. How is this prevented?  Get a flu shot every year. You may get the flu shot in late summer, fall, or winter. Ask your doctor when you should get your flu shot. Avoid contact with people who are sick during fall and winter. This is cold and flu season. Contact a doctor if: You  get new symptoms. You have: Chest pain. Watery poop (diarrhea). A fever. Your cough gets worse. You start to have more mucus. You feel sick to your stomach. You throw up. Get help right away if you: Have shortness of breath. Have trouble breathing. Have skin or nails that turn a bluish color. Have very bad pain or stiffness in your neck. Get a sudden headache. Get sudden pain in your face or ear. Cannot eat or drink without throwing up. These symptoms may represent a serious problem that is an emergency. Get medical help right away. Call your local emergency services (911 in the [place] Do not wait to see if the symptoms will go away. Do not drive yourself to the hospital. Summary Influenza is also called "the flu." It is an infection in the lungs, nose, and throat. It spreads easily from person to person. Take over-the-counter and prescription medicines only as told by your doctor. Getting a flu shot every year is the best way to not get the flu. This information is not intended to replace advice given to you by your health care provider. Make sure you discuss any questions you have with your health care provider. Document Revised: 09/08/2019 Document Reviewed: 09/08/2019 Elsevier Patient Education  2023 Elsevier Inc. Tdap (Tetanus, Diphtheria, Pertussis) Vaccine: What You Need to Know 1. Why get vaccinated? Tdap vaccine can prevent tetanus, diphtheria, and pertussis. Diphtheria and pertussis spread from person to person. Tetanus enters the body through cuts or wounds. TETANUS (T) causes painful stiffening of the muscles. Tetanus can lead to serious health problems, including being unable to open the mouth, having trouble swallowing and breathing, or death. DIPHTHERIA (D) can lead to difficulty breathing, heart failure, paralysis, or death. PERTUSSIS (aP), also known as "whooping cough," can cause uncontrollable, violent coughing that makes it hard to breathe, eat, or drink.  Pertussis can be extremely serious especially in babies and young children, causing pneumonia, convulsions, brain damage, or death. In teens and adults, it can cause weight loss, loss of bladder control, passing out, and rib fractures from severe coughing. 2. Tdap vaccine Tdap is only for children 7 years and older, adolescents, and adults.  Adolescents should receive a single dose of Tdap, preferably at age 43 or 12 years. Pregnant people should get a dose of Tdap during every pregnancy, preferably during the early part of the third trimester, to help protect the newborn from pertussis. Infants are most at risk for severe, life-threatening complications from pertussis. Adults who have never received Tdap should get a dose of Tdap. Also, adults should receive a booster dose of either Tdap or Td (a different vaccine that protects against tetanus and diphtheria but not pertussis) every 10 years, or after 5 years in the case of a severe or dirty wound or burn. Tdap may be given at the same time as other vaccines. 3. Talk with your health care provider Tell your vaccine provider if the person getting the vaccine: Has had an allergic reaction after a previous dose of any vaccine that protects against tetanus,  diphtheria, or pertussis, or has any severe, life-threatening allergies Has had a coma, decreased level of consciousness, or prolonged seizures within 7 days after a previous dose of any pertussis vaccine (DTP, DTaP, or Tdap) Has seizures or another nervous system problem Has ever had Guillain-Barr Syndrome (also called "GBS") Has had severe pain or swelling after a previous dose of any vaccine that protects against tetanus or diphtheria In some cases, your health care provider may decide to postpone Tdap vaccination until a future visit. People with minor illnesses, such as a cold, may be vaccinated. People who are moderately or severely ill should usually wait until they recover before getting  Tdap vaccine.  Your health care provider can give you more information. 4. Risks of a vaccine reaction Pain, redness, or swelling where the shot was given, mild fever, headache, feeling tired, and nausea, vomiting, diarrhea, or stomachache sometimes happen after Tdap vaccination. People sometimes faint after medical procedures, including vaccination. Tell your provider if you feel dizzy or have vision changes or ringing in the ears.  As with any medicine, there is a very remote chance of a vaccine causing a severe allergic reaction, other serious injury, or death. 5. What if there is a serious problem? An allergic reaction could occur after the vaccinated person leaves the clinic. If you see signs of a severe allergic reaction (hives, swelling of the face and throat, difficulty breathing, a fast heartbeat, dizziness, or weakness), call 9-1-1 and get the person to the nearest hospital. For other signs that concern you, call your health care provider.  Adverse reactions should be reported to the Vaccine Adverse Event Reporting System (VAERS). Your health care provider will usually file this report, or you can do it yourself. Visit the VAERS website at www.vaers.LAgents.no or call 7478136884. VAERS is only for reporting reactions, and VAERS staff members do not give medical advice. 6. The National Vaccine Injury Compensation Program The [employer] Vaccine Injury Compensation Program (VICP) is a federal program that was created to compensate people who may have been injured by certain vaccines. Claims regarding alleged injury or death due to vaccination have a time limit for filing, which may be as short as two years. Visit the VICP website at SpiritualWord.at or call (404)823-4643 to learn about the program and about filing a claim. 7. How can I learn more? Ask your health care provider. Call your local or state health department. Visit the website of the Food and Drug Administration  (FDA) for vaccine package inserts and additional information at FinderList.no. Contact the Centers for Disease Control and Prevention (CDC): Call 760-316-7859 (1-800-CDC-INFO) or Visit CDC's website at PicCapture.uy. Source: CDC Vaccine Information Statement Tdap (Tetanus, Diphtheria, Pertussis) Vaccine (09/08/2019) This same material is available at FootballExhibition.com.br for no charge. This information is not intended to replace advice given to you by your health care provider. Make sure you discuss any questions you have with your health care provider. Document Revised: 12/18/2020 Document Reviewed: 10/21/2020 Elsevier Patient Education  2023 [employer].

## 2021-09-26 ENCOUNTER — Ambulatory Visit (INDEPENDENT_AMBULATORY_CARE_PROVIDER_SITE_OTHER): Payer: Self-pay | Admitting: *Deleted

## 2021-09-26 NOTE — Telephone Encounter (Signed)
Please contact pharmacy

## 2021-09-26 NOTE — Telephone Encounter (Signed)
Summary: question on how to mix medicine   Leeann from [facility] pharmacy called in needs to know how to mix med, TRIAMCINOLONE 0.1 % CREAM : EUCERIN) CREAM  should it be equal parts or half and half mixed.      Reason for Disposition  [1] Pharmacy calling with prescription question AND [2] triager unable to answer question  Answer Assessment - Initial Assessment Questions 1. NAME of MEDICINE: "What medicine(s) are you calling about?"     Triamcinolone Acetonide (TRIAMCINOLONE 0.1 % CREAM : EUCERIN) CREAM 2. QUESTION: "What is your question?" (e.g., double dose of medicine, side effect)     How does provider want this mixed? 3. PRESCRIBER: "Who prescribed the medicine?" Reason: if prescribed by specialist, call should be referred to that group.     PCP  Protocols used: Medication Question Call-A-AH

## 2021-09-28 ENCOUNTER — Other Ambulatory Visit (INDEPENDENT_AMBULATORY_CARE_PROVIDER_SITE_OTHER): Payer: Self-pay | Admitting: Primary Care

## 2021-09-28 MED ORDER — TRIAMCINOLONE ACETONIDE 0.1 % EX CREA: TOPICAL_CREAM | Freq: Three times a day (TID) | CUTANEOUS | 1 refills | Status: DC

## 2021-10-07 ENCOUNTER — Telehealth (INDEPENDENT_AMBULATORY_CARE_PROVIDER_SITE_OTHER): Payer: Self-pay | Admitting: Primary Care

## 2021-10-07 NOTE — Telephone Encounter (Signed)
Caller states insurance will not covertriamcinolone 0.1%-Eucerin equivalent 1:1 cream mixture  because Eucerin can be Nurse, mental health inquiring if a different compound can be prescribed or otc recommendations  Please advise

## 2021-10-07 NOTE — Telephone Encounter (Signed)
Routed to PCP 

## 2021-10-08 ENCOUNTER — Other Ambulatory Visit (INDEPENDENT_AMBULATORY_CARE_PROVIDER_SITE_OTHER): Payer: Self-pay | Admitting: Primary Care

## 2021-10-09 ENCOUNTER — Other Ambulatory Visit (INDEPENDENT_AMBULATORY_CARE_PROVIDER_SITE_OTHER): Payer: Self-pay | Admitting: Primary Care

## 2021-10-14 NOTE — Telephone Encounter (Signed)
Pt returned call. Pt is wanting to know if she can use the cream without the mix or would provider want to try something different. Will forward to provider

## 2021-10-14 NOTE — Telephone Encounter (Signed)
Returned pt mother call no answer lvm

## 2021-10-14 NOTE — Telephone Encounter (Signed)
Pt's mother called back reporting that she is still awaiting correspondence. She is requesting to speak to the clinic today.  Best contactLateisha, [name] (Mother)  [phone]

## 2021-10-16 ENCOUNTER — Other Ambulatory Visit (INDEPENDENT_AMBULATORY_CARE_PROVIDER_SITE_OTHER): Payer: Self-pay | Admitting: Primary Care

## 2021-10-16 MED ORDER — EUCERIN EX CREA
TOPICAL_CREAM | Freq: Three times a day (TID) | CUTANEOUS | 1 refills | Status: DC
Start: 2021-10-16 — End: 2021-11-29

## 2021-10-16 NOTE — Telephone Encounter (Signed)
Pts mother is calling back to check on the status of the below requests. States medication should be sent to [facility] instead of [facility]. CB- [phone]

## 2021-10-16 NOTE — Telephone Encounter (Signed)
Will forward to provider  

## 2021-10-17 NOTE — Telephone Encounter (Signed)
Contacted pt to make aware that new rx was sent to new pharmacy pt did answer and was unable to lvm

## 2021-11-25 ENCOUNTER — Telehealth (INDEPENDENT_AMBULATORY_CARE_PROVIDER_SITE_OTHER): Payer: Self-pay | Admitting: Primary Care

## 2021-11-25 NOTE — Telephone Encounter (Signed)
Copied from [place] [phone]. Topic: General - Other >> Nov 25, 2021 12:21 PM Oley Balm E wrote: Reason for CRM: Pt's mother called reporting that she has called several times regarding the patient's compound cream. She says insurance will not pay for the compound but they will pay for the medicine just not the cream it was going to be mixed with.   Wants a call back from the clinic today regarding this [phone]

## 2021-11-25 NOTE — Telephone Encounter (Signed)
Will forward to provider  

## 2021-11-29 ENCOUNTER — Other Ambulatory Visit (INDEPENDENT_AMBULATORY_CARE_PROVIDER_SITE_OTHER): Payer: Self-pay | Admitting: Primary Care

## 2021-11-29 MED ORDER — TRIAMCINOLONE ACETONIDE 0.1 % EX CREA: 1.0000 | TOPICAL_CREAM | Freq: Four times a day (QID) | CUTANEOUS | 1 refills | Status: DC

## 2021-12-01 NOTE — Telephone Encounter (Signed)
Contacted pt to go over provider message pt didn't answer lvm and if she has questions or concerns to give [place] a call

## 2021-12-12 ENCOUNTER — Other Ambulatory Visit (INDEPENDENT_AMBULATORY_CARE_PROVIDER_SITE_OTHER): Payer: Self-pay | Admitting: Primary Care

## 2021-12-12 DIAGNOSIS — Z30019 Encounter for initial prescription of contraceptives, unspecified: Secondary | ICD-10-CM

## 2021-12-12 MED ORDER — LO LOESTRIN FE 1 MG-10 MCG / 10 MCG PO TABS: 1.0000 | ORAL_TABLET | Freq: Every day | ORAL | 6 refills | Status: DC

## 2022-02-06 ENCOUNTER — Other Ambulatory Visit (INDEPENDENT_AMBULATORY_CARE_PROVIDER_SITE_OTHER): Payer: Self-pay | Admitting: Primary Care

## 2022-02-06 NOTE — Telephone Encounter (Signed)
Requested medications are due for refill today.  Unsure  Requested medications are on the active medications list.  yes  Last refill. 11/29/2021 453.6 1 rf  Future visit scheduled.   no  Notes to clinic.  Refill not delegated.    Requested Prescriptions  Pending Prescriptions Disp Refills   triamcinolone cream (KENALOG) 0.1 % [Pharmacy Med Name: Triamcinolone Acetonide 0.1 % External Cream] 454 g 0    Sig: APPLY  CREAM EXTERNALLY TO AFFECTED AREA 4 TIMES DAILY     Not Delegated - Dermatology:  Corticosteroids Failed - 02/06/2022 10:20 AM      Failed - This refill cannot be delegated      Passed - Valid encounter within last 12 months    Recent Outpatient Visits           4 months ago Need for Tdap vaccination   [place], Michelle P, NP   10 months ago Cervical cancer screening   [place] Kerin Perna, NP   1 year ago Encounter to establish care   [place] Kerin Perna, NP

## 2022-03-24 ENCOUNTER — Telehealth: Payer: Self-pay | Admitting: Emergency Medicine

## 2022-03-24 NOTE — Telephone Encounter (Signed)
error 

## 2022-03-24 NOTE — Telephone Encounter (Signed)
Copied from [place] [id number]. Topic: Referral - Request for Referral >> Mar 24, 2022 12:13 PM Chapman Fitch wrote: Has patient seen PCP for this complaint? Yes  *If NO, is insurance requiring patient see PCP for this issue before PCP can refer them? Referral for which specialty: Endo  Preferred provider/office: closer to bus route or home  Reason for referral: type 1 diabetes [place] [address] [place] bus line/ please advise

## 2022-03-26 ENCOUNTER — Ambulatory Visit: Payer: Self-pay

## 2022-03-26 NOTE — Progress Notes (Unsigned)
I spoke to patient's mother and provided her with the phone  number for [facility]

## 2022-06-16 LAB — HM DIABETES EYE EXAM

## 2022-07-13 ENCOUNTER — Other Ambulatory Visit (INDEPENDENT_AMBULATORY_CARE_PROVIDER_SITE_OTHER): Payer: Self-pay | Admitting: Primary Care

## 2022-07-14 NOTE — Telephone Encounter (Signed)
Requested medications are due for refill today.  Unsure  Requested medications are on the active medications list.  yes  Last refill. 02/09/2022 454g 0 rf  Future visit scheduled.   no  Notes to clinic.  Refill not delegated.    Requested Prescriptions  Pending Prescriptions Disp Refills   triamcinolone cream (KENALOG) 0.1 % [Pharmacy Med Name: Triamcinolone Acetonide 0.1 % External Cream] 454 g 0    Sig: APPLY  CREAM EXTERNALLY TO AFFECTED AREA 4 TIMES DAILY     Not Delegated - Dermatology:  Corticosteroids Failed - 07/13/2022  6:14 PM      Failed - This refill cannot be delegated      Passed - Valid encounter within last 12 months    Recent Outpatient Visits           9 months ago Need for Tdap vaccination   [place] [facility] Grayce Sessions, NP   1 year ago Cervical cancer screening   [place] [facility] Grayce Sessions, NP   2 years ago Encounter to establish care   [place] [facility] Grayce Sessions, NP

## 2022-07-16 NOTE — Telephone Encounter (Signed)
Will forward to provider  

## 2022-09-23 LAB — BASIC METABOLIC PANEL
BUN: 7 (ref 4–21)
CO2: 23 — AB (ref 13–22)
Chloride: 104 (ref 99–108)
Creatinine: 0.7 (ref 0.5–1.1)
Glucose: 137
Potassium: 3.9 meq/L (ref 3.5–5.1)
Sodium: 136 — AB (ref 137–147)

## 2022-09-23 LAB — HEPATIC FUNCTION PANEL
ALT: 12 U/L (ref 7–35)
AST: 15 (ref 13–35)
Alkaline Phosphatase: 64 (ref 25–125)
Bilirubin, Total: 7.6

## 2022-09-23 LAB — PROTEIN / CREATININE RATIO, URINE
Albumin, U: <7
Creatinine, Urine: 151

## 2022-09-23 LAB — LIPID PANEL
Cholesterol: 204 — AB (ref 0–200)
HDL: 47 (ref 35–70)
LDL Cholesterol: 137
Triglycerides: 102 (ref 40–160)

## 2022-09-23 LAB — COMPREHENSIVE METABOLIC PANEL
Calcium: 9.4 (ref 8.7–10.7)
eGFR: >90

## 2022-09-23 LAB — HEMOGLOBIN A1C: Hemoglobin A1C: 7.1

## 2022-10-09 ENCOUNTER — Other Ambulatory Visit (INDEPENDENT_AMBULATORY_CARE_PROVIDER_SITE_OTHER): Payer: Self-pay | Admitting: Primary Care

## 2022-10-09 DIAGNOSIS — Z30019 Encounter for initial prescription of contraceptives, unspecified: Secondary | ICD-10-CM

## 2022-10-20 ENCOUNTER — Ambulatory Visit (INDEPENDENT_AMBULATORY_CARE_PROVIDER_SITE_OTHER): Payer: Medicaid Other | Admitting: Primary Care

## 2022-10-26 ENCOUNTER — Telehealth (INDEPENDENT_AMBULATORY_CARE_PROVIDER_SITE_OTHER): Payer: Self-pay | Admitting: Primary Care

## 2022-10-26 NOTE — Telephone Encounter (Signed)
Left VM with Pt about upcoming apt on 9/24

## 2022-10-27 ENCOUNTER — Other Ambulatory Visit (HOSPITAL_COMMUNITY)
Admission: RE | Admit: 2022-10-27 | Discharge: 2022-10-27 | Disposition: A | Discharge status: Home / Self Care | Payer: Medicaid Other | Source: Ambulatory Visit | Attending: Primary Care | Admitting: Primary Care

## 2022-10-27 ENCOUNTER — Ambulatory Visit (INDEPENDENT_AMBULATORY_CARE_PROVIDER_SITE_OTHER): Payer: Medicaid Other | Admitting: Primary Care

## 2022-10-27 ENCOUNTER — Encounter (INDEPENDENT_AMBULATORY_CARE_PROVIDER_SITE_OTHER): Payer: Self-pay | Admitting: Primary Care

## 2022-10-27 VITALS — BP 115/75 | HR 107 | Resp 16 | Ht 59.5 in | Wt 130.6 lb

## 2022-10-27 DIAGNOSIS — Z113 Encounter for screening for infections with a predominantly sexual mode of transmission: Secondary | ICD-10-CM

## 2022-10-27 DIAGNOSIS — Z30019 Encounter for initial prescription of contraceptives, unspecified: Secondary | ICD-10-CM | POA: Diagnosis not present

## 2022-10-27 LAB — POCT URINE PREGNANCY: Preg Test, Ur: NEGATIVE

## 2022-10-27 MED ORDER — LO LOESTRIN FE 1 MG-10 MCG / 10 MCG PO TABS: 1.0000 | ORAL_TABLET | Freq: Every day | ORAL | 6 refills | Status: DC

## 2022-10-27 NOTE — Progress Notes (Signed)
[facility]  [name], is a 27 y.o. female  JXB:[id number]  ZHY:[id number]  DOB - 1995-10-28  Chief Complaint  Patient presents with   Medication Refill       Subjective:   [name] is a 27 y.o. female here today for birth control management.  Requesting a refill on her oral contraception.  Also requesting a screening for STDs.  Patient has No headache, No chest pain, No abdominal pain - No Nausea, No new weakness tingling or numbness, No Cough - shortness of breath  No problems updated.  No Known Allergies  Past Medical History:  Diagnosis Date   Seizures (HCC)    Seizures (HCC)     Current Outpatient Medications on File Prior to Visit  Medication Sig Dispense Refill   blood glucose meter kit and supplies KIT Dispense based on patient and insurance preference. Use up to four times daily as directed. (FOR ICD-9 250.00, 250.01). 1 each 0   insulin aspart protamine- aspart (NOVOLOG MIX 70/30) (70-30) 100 UNIT/ML injection Inject 0.12 mLs (12 Units total) into the skin 2 (two) times daily with a meal. 10 mL 11   Insulin Pen Needle (PEN NEEDLES 3/16") 31G X 5 MM MISC Use as directed 100 each 5   metFORMIN (GLUCOPHAGE) 1000 MG tablet Take 1,000 mg by mouth 2 (two) times daily with a meal.     triamcinolone cream (KENALOG) 0.1 % APPLY  CREAM EXTERNALLY TO AFFECTED AREA 4 TIMES DAILY 454 g 0   No current facility-administered medications on file prior to visit.    Objective:   Vitals:   10/27/22 1623  BP: 115/75  Pulse: (!) 107  Resp: 16  SpO2: 98%  Weight: 130 lb 9.6 oz (59.2 kg)  Height: 4' 11.5" (1.511 m)    Comprehensive ROS Pertinent positive and negative noted in HPI   Exam General appearance : Awake, alert, not in any distress. Speech Clear. Not toxic looking HEENT: Atraumatic and Normocephalic, pupils equally reactive to light and accomodation Neck: Supple, no JVD. No cervical lymphadenopathy.  Chest: Good air entry bilaterally, no  added sounds  CVS: S1 S2 regular, no murmurs.  Abdomen: Bowel sounds present, Non tender and not distended with no gaurding, rigidity or rebound. Extremities: B/L Lower Ext shows no edema, both legs are warm to touch Neurology: Awake alert, and oriented X 3, CN II-XII intact, Non focal Skin: No Rash  Data Review Lab Results  Component Value Date   HGBA1C 7.9 01/17/2021   HGBA1C 12.7 (H) 04/27/2017    Assessment & Plan   [name] was seen today for medication refill.  Diagnoses and all orders for this visit:  Screening examination for STD (sexually transmitted disease) -     Cervicovaginal ancillary only  Encounter for female birth control -     Norethindrone-Ethinyl Estradiol-Fe Biphas (LO LOESTRIN FE) 1 MG-10 MCG / 10 MCG tablet; Take 1 tablet by mouth daily. -     POCT urine pregnancy     Patient have been counseled extensively about nutrition and exercise. Other issues discussed during this visit include: low cholesterol diet, weight control and daily exercise, foot care, annual eye examinations at Ophthalmology, importance of adherence with medications and regular follow-up. We also discussed long term complications of uncontrolled diabetes and hypertension.   Return for annual physical.  The patient was given clear instructions to go to ER or return to medical center if symptoms don't improve, worsen or new problems develop. The patient verbalized understanding.  The patient was told to call to get lab results if they haven't heard anything in the next week.   This note has been created with Education officer, environmental. Any transcriptional errors are unintentional.   Grayce Sessions, NP 10/27/2022, 8:37 PM

## 2022-10-29 LAB — CERVICOVAGINAL ANCILLARY ONLY

## 2022-11-04 ENCOUNTER — Other Ambulatory Visit (INDEPENDENT_AMBULATORY_CARE_PROVIDER_SITE_OTHER): Payer: Self-pay | Admitting: Primary Care

## 2022-11-04 MED ORDER — TRIAMCINOLONE ACETONIDE 0.1 % EX CREA: TOPICAL_CREAM | Freq: Two times a day (BID) | CUTANEOUS | 0 refills | Status: DC

## 2022-12-02 ENCOUNTER — Other Ambulatory Visit (INDEPENDENT_AMBULATORY_CARE_PROVIDER_SITE_OTHER): Payer: Self-pay | Admitting: Primary Care

## 2022-12-03 NOTE — Telephone Encounter (Signed)
Requested medication (s) are due for refill today: yes  Requested medication (s) are on the active medication list: yes  Last refill:  11/04/22  Future visit scheduled: yes  Notes to clinic:  Unable to refill per protocol, cannot delegate.      Requested Prescriptions  Pending Prescriptions Disp Refills   triamcinolone cream (KENALOG) 0.1 % [Pharmacy Med Name: Triamcinolone Acetonide 0.1 % External Cream] 454 g 0    Sig: APPLY  CREAM EXTERNALLY TWICE DAILY     Not Delegated - Dermatology:  Corticosteroids Failed - 12/02/2022 12:03 PM      Failed - This refill cannot be delegated      Passed - Valid encounter within last 12 months    Recent Outpatient Visits           1 month ago Screening examination for STD (sexually transmitted disease)   [place] [facility] Grayce Sessions, NP   1 year ago Need for Tdap vaccination   [place] [facility] Grayce Sessions, NP   1 year ago Cervical cancer screening   [place] [facility] Grayce Sessions, NP   2 years ago Encounter to establish care   [place] [facility] Grayce Sessions, NP       Future Appointments             In 1 week Randa Evens, Kinnie Scales, NP [place] [facility]

## 2022-12-07 NOTE — Telephone Encounter (Signed)
Will forward to provider  

## 2022-12-10 ENCOUNTER — Other Ambulatory Visit (INDEPENDENT_AMBULATORY_CARE_PROVIDER_SITE_OTHER): Payer: Self-pay

## 2022-12-10 ENCOUNTER — Encounter (INDEPENDENT_AMBULATORY_CARE_PROVIDER_SITE_OTHER): Payer: Self-pay | Admitting: Primary Care

## 2022-12-10 ENCOUNTER — Ambulatory Visit (INDEPENDENT_AMBULATORY_CARE_PROVIDER_SITE_OTHER): Payer: Medicaid Other | Admitting: Primary Care

## 2022-12-10 VITALS — BP 119/82 | HR 67 | Resp 16 | Ht 59.5 in | Wt 131.0 lb

## 2022-12-10 DIAGNOSIS — Z0000 Encounter for general adult medical examination without abnormal findings: Secondary | ICD-10-CM

## 2022-12-10 DIAGNOSIS — Z2821 Immunization not carried out because of patient refusal: Secondary | ICD-10-CM

## 2022-12-10 DIAGNOSIS — Z7984 Long term (current) use of oral hypoglycemic drugs: Secondary | ICD-10-CM | POA: Diagnosis not present

## 2022-12-10 DIAGNOSIS — E1069 Type 1 diabetes mellitus with other specified complication: Secondary | ICD-10-CM

## 2022-12-10 DIAGNOSIS — E109 Type 1 diabetes mellitus without complications: Secondary | ICD-10-CM

## 2022-12-10 DIAGNOSIS — Z760 Encounter for issue of repeat prescription: Secondary | ICD-10-CM

## 2022-12-10 DIAGNOSIS — Z794 Long term (current) use of insulin: Secondary | ICD-10-CM | POA: Diagnosis not present

## 2022-12-10 DIAGNOSIS — Z30019 Encounter for initial prescription of contraceptives, unspecified: Secondary | ICD-10-CM

## 2022-12-10 DIAGNOSIS — Z789 Other specified health status: Secondary | ICD-10-CM

## 2022-12-10 MED ORDER — LO LOESTRIN FE 1 MG-10 MCG / 10 MCG PO TABS
1.0000 | ORAL_TABLET | Freq: Every day | ORAL | 6 refills | Status: DC
Start: 2022-12-10 — End: 2023-11-15

## 2022-12-10 MED ORDER — TRIAMCINOLONE ACETONIDE 0.1 % EX CREA: TOPICAL_CREAM | Freq: Two times a day (BID) | CUTANEOUS | 0 refills | Status: AC

## 2022-12-10 NOTE — Progress Notes (Signed)
[facility]  [name] is a 27 y.o. female presents to office today for annual physical exam examination.    Concerns today include: 1. None  Occupation: Retail , Marital status: S, Substance use: N Diet: N, Exercise: yes   Health Maintenance  Topic Date Due   Hepatitis C Screening  Never done   FOOT EXAM  01/17/2022   COVID-19 Vaccine (3 - 2023-24 season) 10/04/2022   INFLUENZA VACCINE  05/03/2023 (Originally 09/03/2022)   HEMOGLOBIN A1C  03/26/2023   OPHTHALMOLOGY EXAM  06/16/2023   Diabetic kidney evaluation - eGFR measurement  09/23/2023   Diabetic kidney evaluation - Urine ACR  09/23/2023   Cervical Cancer Screening (Pap smear)  03/19/2024   DTaP/Tdap/Td (2 - Td or Tdap) 09/24/2031   HIV Screening  Completed   HPV VACCINES  Aged Out     Past Medical History:  Diagnosis Date   Seizures (HCC)    Seizures (HCC)    Social History   Socioeconomic History   Marital status: Single    Spouse name: Not on file   Number of children: Not on file   Years of education: Not on file   Highest education level: Not on file  Occupational History   Not on file  Tobacco Use   Smoking status: Never   Smokeless tobacco: Never  Substance and Sexual Activity   Alcohol use: No   Drug use: No   Sexual activity: Not on file  Other Topics Concern   Not on file  Social History Narrative   Not on file   Social Determinants of Health   Financial Resource Strain: Not on file  Food Insecurity: Not on file  Transportation Needs: No Transportation Needs (03/26/2022)   PRAPARE - Transportation    Lack of Transportation (Medical): No    Lack of Transportation (Non-Medical): No  Physical Activity: Not on file  Stress: Not on file  Social Connections: Not on file  Intimate Partner Violence: Not on file   No past surgical history on file. Family History  Problem Relation Age of Onset   Hypertension Mother    Autoimmune disease Neg Hx     Current Outpatient  Medications:    insulin aspart protamine- aspart (NOVOLOG MIX 70/30) (70-30) 100 UNIT/ML injection, Inject 0.12 mLs (12 Units total) into the skin 2 (two) times daily with a meal., Disp: 10 mL, Rfl: 11   Insulin Pen Needle (PEN NEEDLES 3/16") 31G X 5 MM MISC, Use as directed, Disp: 100 each, Rfl: 5   metFORMIN (GLUCOPHAGE) 1000 MG tablet, Take 1,000 mg by mouth 2 (two) times daily with a meal., Disp: , Rfl:    Norethindrone-Ethinyl Estradiol-Fe Biphas (LO LOESTRIN FE) 1 MG-10 MCG / 10 MCG tablet, Take 1 tablet by mouth daily., Disp: 56 tablet, Rfl: 6   triamcinolone cream (KENALOG) 0.1 %, Apply topically 2 (two) times daily., Disp: 454 g, Rfl: 0   blood glucose meter kit and supplies KIT, Dispense based on patient and insurance preference. Use up to four times daily as directed. (FOR ICD-9 250.00, 250.01). (Patient not taking: Reported on 12/10/2022), Disp: 1 each, Rfl: 0 Outpatient Encounter Medications as of 12/10/2022  Medication Sig   insulin aspart protamine- aspart (NOVOLOG MIX 70/30) (70-30) 100 UNIT/ML injection Inject 0.12 mLs (12 Units total) into the skin 2 (two) times daily with a meal.   Insulin Pen Needle (PEN NEEDLES 3/16") 31G X 5 MM MISC Use as directed   metFORMIN (GLUCOPHAGE) 1000 MG tablet  Take 1,000 mg by mouth 2 (two) times daily with a meal.   Norethindrone-Ethinyl Estradiol-Fe Biphas (LO LOESTRIN FE) 1 MG-10 MCG / 10 MCG tablet Take 1 tablet by mouth daily.   triamcinolone cream (KENALOG) 0.1 % Apply topically 2 (two) times daily.   blood glucose meter kit and supplies KIT Dispense based on patient and insurance preference. Use up to four times daily as directed. (FOR ICD-9 250.00, 250.01). (Patient not taking: Reported on 12/10/2022)   No facility-administered encounter medications on file as of 12/10/2022.    No Known Allergies   ROS: Review of Systems Pertinent items noted in HPI and remainder of comprehensive ROS otherwise negative.    Physical exam General: No  apparent distress. Eyes: Extraocular eye movements intact, pupils equal and round. Neck: Supple, trachea midline. Thyroid: No enlargement, mobile without fixation, no tenderness. Cardiovascular: Regular rhythm and rate, no murmur, normal radial pulses. Respiratory: Normal respiratory effort, clear to auscultation. Gastrointestinal: Normal pitch active bowel sounds, nontender abdomen without distention or appreciable hepatomegaly. Musculoskeletal: Normal muscle tone, no tenderness on palpation of tibia, no excessive thoracic kyphosis. Skin: Appropriate warmth, no visible rash. Mental status: Alert, conversant, speech clear, thought logical, appropriate mood and affect, no hallucinations or delusions evident. Hematologic/lymphatic: No cervical adenopathy, no visible ecchymoses.    Assessment/ Plan: Collene was seen today for annual exam.  Diagnoses and all orders for this visit: Annual physical exam Completed   Influenza vaccination declined  Type 1 diabetes mellitus with other specified complication (HCC) Manage by endocrinology   Encounter for medication refill -     Norethindrone-Ethinyl Estradiol-Fe Biphas (LO LOESTRIN FE) 1 MG-10 MCG / 10 MCG tablet; Take 1 tablet by mouth daily.  Encounter for female birth control 2/2 Uses birth control -     Norethindrone-Ethinyl Estradiol-Fe Biphas (LO LOESTRIN FE) 1 MG-10 MCG / 10 MCG tablet; Take 1 tablet by mouth daily.  Encounter for comprehensive diabetic foot examination, type 1 diabetes mellitus (HCC) Completed    Counseled on healthy lifestyle choices, including diet (rich in fruits, vegetables and lean meats and low in salt and simple carbohydrates) and exercise (at least 30 minutes of moderate physical activity daily).  Patient to follow up in 1 year for annual exam or sooner if needed.  The above assessment and management plan was discussed with the patient. The patient verbalized understanding of and has agreed to the  management plan. Patient is aware to call the clinic if symptoms persist or worsen. Patient is aware when to return to the clinic for a follow-up visit. Patient educated on when it is appropriate to go to the emergency department.   This note has been created with Education officer, environmental. Any transcriptional errors are unintentional.   Grayce Sessions, NP 12/10/2022, 12:05 PM

## 2023-05-10 ENCOUNTER — Encounter (HOSPITAL_COMMUNITY): Payer: Self-pay

## 2023-05-10 ENCOUNTER — Emergency Department (HOSPITAL_COMMUNITY)
Admission: EM | Admit: 2023-05-10 | Discharge: 2023-05-11 | Disposition: A | Discharge status: Home / Self Care | Attending: Emergency Medicine | Admitting: Emergency Medicine

## 2023-05-10 DIAGNOSIS — E119 Type 2 diabetes mellitus without complications: Secondary | ICD-10-CM | POA: Insufficient documentation

## 2023-05-10 DIAGNOSIS — Z794 Long term (current) use of insulin: Secondary | ICD-10-CM | POA: Insufficient documentation

## 2023-05-10 DIAGNOSIS — R197 Diarrhea, unspecified: Secondary | ICD-10-CM | POA: Diagnosis not present

## 2023-05-10 DIAGNOSIS — R112 Nausea with vomiting, unspecified: Secondary | ICD-10-CM | POA: Diagnosis present

## 2023-05-10 DIAGNOSIS — R1013 Epigastric pain: Secondary | ICD-10-CM | POA: Insufficient documentation

## 2023-05-10 DIAGNOSIS — Z7984 Long term (current) use of oral hypoglycemic drugs: Secondary | ICD-10-CM | POA: Insufficient documentation

## 2023-05-10 HISTORY — DX: Type 2 diabetes mellitus without complications: E11.9

## 2023-05-10 LAB — CBG MONITORING, ED: Glucose-Capillary: 205 mg/dL — ABNORMAL HIGH (ref 70–99)

## 2023-05-10 LAB — COMPREHENSIVE METABOLIC PANEL WITH GFR
ALT: 20 U/L (ref 0–44)
AST: 23 U/L (ref 15–41)
Albumin: 4.3 g/dL (ref 3.5–5.0)
Alkaline Phosphatase: 69 U/L (ref 38–126)
Anion gap: 11 (ref 5–15)
BUN: 11 mg/dL (ref 6–20)
CO2: 19 mmol/L — ABNORMAL LOW (ref 22–32)
Calcium: 9.2 mg/dL (ref 8.9–10.3)
Chloride: 105 mmol/L (ref 98–111)
Creatinine, Ser: 0.75 mg/dL (ref 0.44–1.00)
GFR, Estimated: >60 mL/min (ref >60)
Glucose, Bld: 223 mg/dL — ABNORMAL HIGH (ref 70–99)
Potassium: 3.5 mmol/L (ref 3.5–5.1)
Sodium: 135 mmol/L (ref 135–145)
Total Bilirubin: 1.1 mg/dL (ref 0.0–1.2)
Total Protein: 8.6 g/dL — ABNORMAL HIGH (ref 6.5–8.1)

## 2023-05-10 LAB — CBC
HCT: 42.2 % (ref 36.0–46.0)
Hemoglobin: 14 g/dL (ref 12.0–15.0)
MCH: 22.8 pg — ABNORMAL LOW (ref 26.0–34.0)
MCHC: 33.2 g/dL (ref 30.0–36.0)
MCV: 68.7 fL — ABNORMAL LOW (ref 80.0–100.0)
Platelets: 264 10*3/uL (ref 150–400)
RBC: 6.14 MIL/uL — ABNORMAL HIGH (ref 3.87–5.11)
RDW: 13.8 % (ref 11.5–15.5)
WBC: 9 10*3/uL (ref 4.0–10.5)
nRBC: 0 % (ref 0.0–0.2)

## 2023-05-10 LAB — LIPASE, BLOOD: Lipase: 23 U/L (ref 11–51)

## 2023-05-10 MED ORDER — ONDANSETRON 4 MG PO TBDP
4.0000 mg | ORAL_TABLET | Freq: Once | ORAL | Status: AC | PRN
  Administered 2023-05-10: 4 mg via ORAL
  Filled 2023-05-10: qty 1

## 2023-05-10 NOTE — ED Triage Notes (Signed)
 Pt arrived POV for n/v/d since this morning with back pain from mid upper that radiates down to lower back. Has not been able to eat today, also having mod abd pain with cramping, otherwise NAD noted. A&O x4.   CBG 205.

## 2023-05-11 ENCOUNTER — Emergency Department (HOSPITAL_COMMUNITY)

## 2023-05-11 ENCOUNTER — Encounter (HOSPITAL_COMMUNITY): Payer: Self-pay

## 2023-05-11 LAB — HCG, QUANTITATIVE, PREGNANCY: hCG, Beta Chain, Quant, S: <1 m[IU]/mL (ref <5)

## 2023-05-11 MED ORDER — METOCLOPRAMIDE HCL 10 MG PO TABS
10.0000 mg | ORAL_TABLET | Freq: Four times a day (QID) | ORAL | 0 refills | Status: AC | PRN
Start: 2023-05-11 — End: ?

## 2023-05-11 MED ORDER — SODIUM CHLORIDE 0.9 % IV BOLUS
1000.0000 mL | Freq: Once | INTRAVENOUS | Status: AC
  Administered 2023-05-11: 1000 mL via INTRAVENOUS

## 2023-05-11 MED ORDER — PANTOPRAZOLE SODIUM 40 MG IV SOLR
40.0000 mg | Freq: Once | INTRAVENOUS | Status: AC
  Administered 2023-05-11: 40 mg via INTRAVENOUS
  Filled 2023-05-11: qty 10

## 2023-05-11 MED ORDER — IOHEXOL 300 MG/ML  SOLN
100.0000 mL | Freq: Once | INTRAMUSCULAR | Status: AC | PRN
  Administered 2023-05-11: 100 mL via INTRAVENOUS

## 2023-05-11 MED ORDER — SODIUM CHLORIDE (PF) 0.9 % IJ SOLN
INTRAMUSCULAR | Status: AC
  Filled 2023-05-11: qty 50

## 2023-05-11 MED ORDER — SUCRALFATE 1 G PO TABS
1.0000 g | ORAL_TABLET | Freq: Once | ORAL | Status: AC
  Administered 2023-05-11: 1 g via ORAL
  Filled 2023-05-11: qty 1

## 2023-05-11 MED ORDER — PANTOPRAZOLE SODIUM 40 MG PO TBEC: 40.0000 mg | DELAYED_RELEASE_TABLET | Freq: Every day | ORAL | 0 refills | Status: AC

## 2023-05-11 NOTE — ED Provider Notes (Signed)
 [place] EMERGENCY DEPARTMENT AT [facility] Provider Note   CSN: [id number] Arrival date & time: 05/10/23  1915     History  Chief Complaint  Patient presents with   N/V/D    [name] is a 28 y.o. female with medical history significant for seizures, diabetes, DKA.  Patient presents to ED for evaluation of nausea, vomiting, diarrhea and abdominal pain.  She reports her symptoms began today.  She denies any known sick contacts.  Denies fevers, blood in stool, blood in vomit, dysuria, flank pain, vaginal discharge.  Denies alcohol use, NSAID use, marijuana use.  Denies medications prior to arrival.  Reports that her menstrual cycle is irregular, she is unsure of her last one.  She was given Zofran in triage and reports that her nausea has dramatically decreased.  She denies any chest pain, shortness of breath.  HPI     Home Medications Prior to Admission medications   Medication Sig Start Date End Date Taking? Authorizing Provider  metoCLOPramide (REGLAN) 10 MG tablet Take 1 tablet (10 mg total) by mouth every 6 (six) hours as needed for nausea or vomiting. 05/11/23  Yes Al Decant, PA-C  pantoprazole (PROTONIX) 40 MG tablet Take 1 tablet (40 mg total) by mouth daily. 05/11/23  Yes Al Decant, PA-C  blood glucose meter kit and supplies KIT Dispense based on patient and insurance preference. Use up to four times daily as directed. (FOR ICD-9 250.00, 250.01). Patient not taking: Reported on 12/10/2022 04/27/17   Rai, Delene Ruffini, MD  insulin aspart protamine- aspart (NOVOLOG MIX 70/30) (70-30) 100 UNIT/ML injection Inject 0.12 mLs (12 Units total) into the skin 2 (two) times daily with a meal. 04/27/17   Rai, Ripudeep K, MD  Insulin Pen Needle (PEN NEEDLES 3/16") 31G X 5 MM MISC Use as directed 04/27/17   Rai, Delene Ruffini, MD  metFORMIN (GLUCOPHAGE) 1000 MG tablet Take 1,000 mg by mouth 2 (two) times daily with a meal. 08/06/21   [provider]   Norethindrone-Ethinyl Estradiol-Fe Biphas (LO LOESTRIN FE) 1 MG-10 MCG / 10 MCG tablet Take 1 tablet by mouth daily. 12/10/22   Grayce Sessions, NP  triamcinolone cream (KENALOG) 0.1 % Apply topically 2 (two) times daily. 12/10/22   Grayce Sessions, NP      Allergies    Patient has no known allergies.    Review of Systems   Review of Systems  Constitutional:  Negative for fever.  Respiratory:  Negative for shortness of breath.   Cardiovascular:  Negative for chest pain.  Gastrointestinal:  Positive for abdominal pain, diarrhea, nausea and vomiting.  Genitourinary:  Negative for dysuria.  All other systems reviewed and are negative.   Physical Exam Updated Vital Signs BP 118/78   Pulse 90   Temp 98.4 F (36.9 C)   Resp 16   Ht 4' 11.5" (1.511 m)   Wt 58.1 kg   SpO2 100%   BMI 25.42 kg/m  Physical Exam Vitals and nursing note reviewed.  Constitutional:      General: She is not in acute distress.    Appearance: She is well-developed.  HENT:     Head: Normocephalic and atraumatic.  Eyes:     Conjunctiva/sclera: Conjunctivae normal.  Cardiovascular:     Rate and Rhythm: Normal rate and regular rhythm.     Heart sounds: No murmur heard. Pulmonary:     Effort: Pulmonary effort is normal. No respiratory distress.     Breath sounds:  Normal breath sounds.  Abdominal:     Palpations: Abdomen is soft.     Tenderness: There is abdominal tenderness.  Musculoskeletal:        General: No swelling.     Cervical back: Neck supple.  Skin:    General: Skin is warm and dry.     Capillary Refill: Capillary refill takes less than 2 seconds.  Neurological:     Mental Status: She is alert.  Psychiatric:        Mood and Affect: Mood normal.     ED Results / Procedures / Treatments   Labs (all labs ordered are listed, but only abnormal results are displayed) Labs Reviewed  COMPREHENSIVE METABOLIC PANEL WITH GFR - Abnormal; Notable for the following components:       Result Value   CO2 19 (*)    Glucose, Bld 223 (*)    Total Protein 8.6 (*)    All other components within normal limits  CBC - Abnormal; Notable for the following components:   RBC 6.14 (*)    MCV 68.7 (*)    MCH 22.8 (*)    All other components within normal limits  CBG MONITORING, ED - Abnormal; Notable for the following components:   Glucose-Capillary 205 (*)    All other components within normal limits  LIPASE, BLOOD  HCG, QUANTITATIVE, PREGNANCY  URINALYSIS, ROUTINE W REFLEX MICROSCOPIC  POC URINE PREG, ED    EKG None  Radiology CT ABDOMEN PELVIS W CONTRAST Result Date: 05/11/2023 CLINICAL DATA:  Back pain with nausea, vomiting and diarrhea. EXAM: CT ABDOMEN AND PELVIS WITH CONTRAST TECHNIQUE: Multidetector CT imaging of the abdomen and pelvis was performed using the standard protocol following bolus administration of intravenous contrast. RADIATION DOSE REDUCTION: This exam was performed according to the departmental dose-optimization program which includes automated exposure control, adjustment of the mA and/or kV according to patient size and/or use of iterative reconstruction technique. CONTRAST:  OMNIPAQUE IOHEXOL 300 MG/ML  SOLN COMPARISON:  April 25, 2017 FINDINGS: Lower chest: No acute abnormality. Hepatobiliary: No focal liver abnormality is seen. No gallstones, gallbladder wall thickening, or biliary dilatation. Pancreas: Unremarkable. No pancreatic ductal dilatation or surrounding inflammatory changes. Spleen: Normal in size without focal abnormality. Adrenals/Urinary Tract: Adrenal glands are unremarkable. Kidneys are normal, without renal calculi, focal lesion, or hydronephrosis. Bladder is unremarkable. Stomach/Bowel: Stomach is within normal limits. Appendix appears normal. No evidence of bowel wall thickening, distention, or inflammatory changes. Vascular/Lymphatic: No significant vascular findings are present. No enlarged abdominal or pelvic lymph nodes.  Reproductive: Uterus and bilateral adnexa are unremarkable. Other: No abdominal wall hernia or abnormality. No abdominopelvic ascites. Musculoskeletal: No acute or significant osseous findings. IMPRESSION: No acute or active process within the abdomen or pelvis. Electronically Signed   By: Aram Candela M.D.   On: 05/11/2023 04:10    Procedures Procedures   Medications Ordered in ED Medications  ondansetron (ZOFRAN-ODT) disintegrating tablet 4 mg (4 mg Oral Given 05/10/23 1930)  sodium chloride 0.9 % bolus 1,000 mL (0 mLs Intravenous Stopped 05/11/23 0302)  iohexol (OMNIPAQUE) 300 MG/ML solution 100 mL (100 mLs Intravenous Contrast Given 05/11/23 0303)  pantoprazole (PROTONIX) injection 40 mg (40 mg Intravenous Given 05/11/23 0322)  sucralfate (CARAFATE) tablet 1 g (1 g Oral Given by Other 05/11/23 1610)    ED Course/ Medical Decision Making/ A&P  Medical Decision Making Amount and/or Complexity of Data Reviewed Labs: ordered. Radiology: ordered.  Risk Prescription drug management.   28 year old female presents for evaluation.  Please see HPI for further details.  On examination the patient is afebrile and nontachycardic.  Her lung sounds are clear bilaterally, she has nonhypoxic.  Abdomen has tenderness in the epigastric region without rebound or guarding, no overlying skin change.  Neurological examinations at baseline.  Patient overall nontoxic in appearance.  Patient has reassuring vital signs.  Will assess with CBC, CMP, hCG quant, lipase, urinalysis.  Will provide patient with Protonix, Zofran and 1 L of fluid.  Will add on CT scan of patient abdomen due to focal location of pain.  Patient CBC without leukocytosis, no anemia.  Metabolic panel with glucose 223, no other electrolyte derangement, no elevated LFTs, anion gap 11.  Photocure CBG 205.  Urinalysis pending.  Beta hCG qualitative negative.  Lipase 23.  CT scan of patient abdomen shows no intra-abdominal process.  Patient  symptoms could be secondary to gastroparesis versus gastritis versus norovirus.  Will send patient home with Reglan and have her treat symptoms conservatively.  Have advised her to continue to push fluids.  Will have her follow-up with her PCP for further management.  Will send her home with Protonix as well.  Stable to discharge.   Final Clinical Impression(s) / ED Diagnoses Final diagnoses:  Epigastric pain  Nausea vomiting and diarrhea    Rx / DC Orders ED Discharge Orders          Ordered    pantoprazole (PROTONIX) 40 MG tablet  Daily        05/11/23 0447    metoCLOPramide (REGLAN) 10 MG tablet  Every 6 hours PRN        05/11/23 0447              Al Decant, PA-C 05/11/23 0448    Nira Conn, MD 05/12/23 2011

## 2023-05-11 NOTE — ED Notes (Signed)
 Lab is going to run HCG quant add on.

## 2023-05-11 NOTE — ED Notes (Signed)
 IV fluids going, Going in at a slower rate with gravity, IV is positional

## 2023-05-11 NOTE — ED Notes (Signed)
 Patient still stating she can't pee to leave urine sample.

## 2023-05-11 NOTE — Discharge Instructions (Addendum)
 It was a pleasure taking part in your care.  As we discussed, your workup here tonight was reassuring.  Please begin taking Reglan every 6 hours as needed for nausea and vomiting.  Please be taking Protonix once a day in the morning to decrease stomach acid.  Please follow-up with your PCP.  Return to the ED with any new or worsening symptoms.

## 2023-05-11 NOTE — ED Notes (Signed)
 Patient transported to CT

## 2023-11-12 ENCOUNTER — Other Ambulatory Visit (INDEPENDENT_AMBULATORY_CARE_PROVIDER_SITE_OTHER): Payer: Self-pay | Admitting: Primary Care

## 2023-11-12 DIAGNOSIS — Z30019 Encounter for initial prescription of contraceptives, unspecified: Secondary | ICD-10-CM

## 2023-11-12 DIAGNOSIS — Z789 Other specified health status: Secondary | ICD-10-CM

## 2023-11-12 DIAGNOSIS — Z760 Encounter for issue of repeat prescription: Secondary | ICD-10-CM

## 2023-11-15 NOTE — Telephone Encounter (Signed)
 Requested Prescriptions  Pending Prescriptions Disp Refills   Norethindrone-Ethinyl Estradiol-Fe Biphas (LO LOESTRIN FE ) 1 MG-10 MCG / 10 MCG tablet [Pharmacy Med Name: Lo Loestrin Fe  1 MG-10 MCG / 10 MCG Oral Tablet] 56 tablet 2    Sig: Take 1 tablet by mouth once daily     OB/GYN:  Contraceptives Passed - 11/15/2023  3:31 PM      Passed - Last BP in normal range    BP Readings from Last 1 Encounters:  05/11/23 122/73         Passed - Valid encounter within last 12 months    Recent Outpatient Visits           11 months ago Annual physical exam   [place] [facility] Celestia Rosaline SQUIBB, NP   1 year ago Screening examination for STD (sexually transmitted disease)   [place] [facility] Celestia Rosaline SQUIBB, NP   2 years ago Need for Tdap vaccination   [place] [facility] Celestia Rosaline SQUIBB, NP   2 years ago Cervical cancer screening   [place] [facility] Celestia Rosaline SQUIBB, NP   3 years ago Encounter to establish care   [place] [facility] Celestia Rosaline SQUIBB, NP       Future Appointments             In 4 weeks Celestia Rosaline SQUIBB, NP  [facility], [place]            Passed - Patient is not a smoker

## 2023-12-13 ENCOUNTER — Encounter (INDEPENDENT_AMBULATORY_CARE_PROVIDER_SITE_OTHER): Payer: Medicaid Other | Admitting: Primary Care

## 2023-12-13 ENCOUNTER — Encounter (INDEPENDENT_AMBULATORY_CARE_PROVIDER_SITE_OTHER): Payer: Self-pay

## 2024-03-03 ENCOUNTER — Other Ambulatory Visit (INDEPENDENT_AMBULATORY_CARE_PROVIDER_SITE_OTHER): Payer: Self-pay | Admitting: Primary Care

## 2024-03-03 DIAGNOSIS — Z30019 Encounter for initial prescription of contraceptives, unspecified: Secondary | ICD-10-CM

## 2024-03-03 DIAGNOSIS — Z789 Other specified health status: Secondary | ICD-10-CM

## 2024-03-03 DIAGNOSIS — Z760 Encounter for issue of repeat prescription: Secondary | ICD-10-CM
